# Patient Record
Sex: Female | Born: 1945 | Race: White | Hispanic: No | Marital: Married | Smoking: Former smoker
Health system: Southern US, Community
[De-identification: ages and names within clinical notes are randomized; demographics above are authoritative.]

## PROBLEM LIST (undated history)

## (undated) DIAGNOSIS — E079 Disorder of thyroid, unspecified: Secondary | ICD-10-CM

## (undated) DIAGNOSIS — J449 Chronic obstructive pulmonary disease, unspecified: Secondary | ICD-10-CM

## (undated) DIAGNOSIS — M1990 Unspecified osteoarthritis, unspecified site: Secondary | ICD-10-CM

## (undated) DIAGNOSIS — M419 Scoliosis, unspecified: Secondary | ICD-10-CM

## (undated) DIAGNOSIS — E785 Hyperlipidemia, unspecified: Secondary | ICD-10-CM

## (undated) DIAGNOSIS — I6529 Occlusion and stenosis of unspecified carotid artery: Secondary | ICD-10-CM

## (undated) DIAGNOSIS — E0500 Thyrotoxicosis with diffuse goiter without thyrotoxic crisis or storm: Secondary | ICD-10-CM

## (undated) DIAGNOSIS — E039 Hypothyroidism, unspecified: Secondary | ICD-10-CM

## (undated) DIAGNOSIS — N8110 Cystocele, unspecified: Secondary | ICD-10-CM

## (undated) DIAGNOSIS — I10 Essential (primary) hypertension: Secondary | ICD-10-CM

## (undated) DIAGNOSIS — M810 Age-related osteoporosis without current pathological fracture: Secondary | ICD-10-CM

## (undated) HISTORY — DX: Cystocele, unspecified: N81.10

## (undated) HISTORY — DX: Unspecified osteoarthritis, unspecified site: M19.90

## (undated) HISTORY — PX: TONSILLECTOMY: SUR1361

## (undated) HISTORY — DX: Occlusion and stenosis of unspecified carotid artery: I65.29

## (undated) HISTORY — DX: Hypothyroidism, unspecified: E03.9

## (undated) HISTORY — DX: Thyrotoxicosis with diffuse goiter without thyrotoxic crisis or storm: E05.00

## (undated) HISTORY — DX: Essential (primary) hypertension: I10

## (undated) HISTORY — DX: Scoliosis, unspecified: M41.9

## (undated) HISTORY — DX: Disorder of thyroid, unspecified: E07.9

## (undated) HISTORY — DX: Hyperlipidemia, unspecified: E78.5

## (undated) HISTORY — DX: Chronic obstructive pulmonary disease, unspecified: J44.9

## (undated) HISTORY — DX: Age-related osteoporosis without current pathological fracture: M81.0

---

## 2015-03-20 DIAGNOSIS — Z780 Asymptomatic menopausal state: Secondary | ICD-10-CM | POA: Diagnosis not present

## 2015-03-20 DIAGNOSIS — E785 Hyperlipidemia, unspecified: Secondary | ICD-10-CM | POA: Diagnosis not present

## 2015-03-20 DIAGNOSIS — Z79899 Other long term (current) drug therapy: Secondary | ICD-10-CM | POA: Diagnosis not present

## 2015-03-20 DIAGNOSIS — E559 Vitamin D deficiency, unspecified: Secondary | ICD-10-CM | POA: Diagnosis not present

## 2015-03-20 DIAGNOSIS — R5383 Other fatigue: Secondary | ICD-10-CM | POA: Diagnosis not present

## 2015-03-25 DIAGNOSIS — E538 Deficiency of other specified B group vitamins: Secondary | ICD-10-CM | POA: Diagnosis not present

## 2015-04-01 DIAGNOSIS — E538 Deficiency of other specified B group vitamins: Secondary | ICD-10-CM | POA: Diagnosis not present

## 2015-04-08 DIAGNOSIS — E538 Deficiency of other specified B group vitamins: Secondary | ICD-10-CM | POA: Diagnosis not present

## 2015-04-13 DIAGNOSIS — J209 Acute bronchitis, unspecified: Secondary | ICD-10-CM | POA: Diagnosis not present

## 2015-04-14 DIAGNOSIS — H3562 Retinal hemorrhage, left eye: Secondary | ICD-10-CM | POA: Diagnosis not present

## 2015-04-14 DIAGNOSIS — H5213 Myopia, bilateral: Secondary | ICD-10-CM | POA: Diagnosis not present

## 2015-04-15 DIAGNOSIS — E538 Deficiency of other specified B group vitamins: Secondary | ICD-10-CM | POA: Diagnosis not present

## 2015-05-15 DIAGNOSIS — E538 Deficiency of other specified B group vitamins: Secondary | ICD-10-CM | POA: Diagnosis not present

## 2015-05-19 DIAGNOSIS — E0590 Thyrotoxicosis, unspecified without thyrotoxic crisis or storm: Secondary | ICD-10-CM | POA: Diagnosis not present

## 2015-05-19 DIAGNOSIS — E0510 Thyrotoxicosis with toxic single thyroid nodule without thyrotoxic crisis or storm: Secondary | ICD-10-CM | POA: Diagnosis not present

## 2015-06-10 DIAGNOSIS — H3562 Retinal hemorrhage, left eye: Secondary | ICD-10-CM | POA: Diagnosis not present

## 2015-06-10 DIAGNOSIS — H2513 Age-related nuclear cataract, bilateral: Secondary | ICD-10-CM | POA: Diagnosis not present

## 2015-06-16 DIAGNOSIS — E538 Deficiency of other specified B group vitamins: Secondary | ICD-10-CM | POA: Diagnosis not present

## 2015-07-17 DIAGNOSIS — E538 Deficiency of other specified B group vitamins: Secondary | ICD-10-CM | POA: Diagnosis not present

## 2015-07-28 DIAGNOSIS — Z9181 History of falling: Secondary | ICD-10-CM | POA: Diagnosis not present

## 2015-07-28 DIAGNOSIS — Z01419 Encounter for gynecological examination (general) (routine) without abnormal findings: Secondary | ICD-10-CM | POA: Diagnosis not present

## 2015-08-01 DIAGNOSIS — Z1231 Encounter for screening mammogram for malignant neoplasm of breast: Secondary | ICD-10-CM | POA: Diagnosis not present

## 2015-08-18 DIAGNOSIS — D510 Vitamin B12 deficiency anemia due to intrinsic factor deficiency: Secondary | ICD-10-CM | POA: Diagnosis not present

## 2015-09-12 DIAGNOSIS — Z86010 Personal history of colonic polyps: Secondary | ICD-10-CM | POA: Diagnosis not present

## 2015-09-12 DIAGNOSIS — Z1211 Encounter for screening for malignant neoplasm of colon: Secondary | ICD-10-CM | POA: Diagnosis not present

## 2015-09-12 DIAGNOSIS — Z8371 Family history of colonic polyps: Secondary | ICD-10-CM | POA: Diagnosis not present

## 2015-09-18 DIAGNOSIS — D510 Vitamin B12 deficiency anemia due to intrinsic factor deficiency: Secondary | ICD-10-CM | POA: Diagnosis not present

## 2015-10-08 DIAGNOSIS — Z8371 Family history of colonic polyps: Secondary | ICD-10-CM | POA: Diagnosis not present

## 2015-10-08 DIAGNOSIS — K648 Other hemorrhoids: Secondary | ICD-10-CM | POA: Diagnosis not present

## 2015-10-08 DIAGNOSIS — D123 Benign neoplasm of transverse colon: Secondary | ICD-10-CM | POA: Diagnosis not present

## 2015-10-08 DIAGNOSIS — Z1211 Encounter for screening for malignant neoplasm of colon: Secondary | ICD-10-CM | POA: Diagnosis not present

## 2015-10-08 DIAGNOSIS — Z79899 Other long term (current) drug therapy: Secondary | ICD-10-CM | POA: Diagnosis not present

## 2015-10-08 DIAGNOSIS — K5730 Diverticulosis of large intestine without perforation or abscess without bleeding: Secondary | ICD-10-CM | POA: Diagnosis not present

## 2015-10-08 DIAGNOSIS — Z86010 Personal history of colonic polyps: Secondary | ICD-10-CM | POA: Diagnosis not present

## 2015-10-16 DIAGNOSIS — D510 Vitamin B12 deficiency anemia due to intrinsic factor deficiency: Secondary | ICD-10-CM | POA: Diagnosis not present

## 2015-11-17 DIAGNOSIS — D510 Vitamin B12 deficiency anemia due to intrinsic factor deficiency: Secondary | ICD-10-CM | POA: Diagnosis not present

## 2015-11-19 DIAGNOSIS — E0590 Thyrotoxicosis, unspecified without thyrotoxic crisis or storm: Secondary | ICD-10-CM | POA: Diagnosis not present

## 2015-11-19 DIAGNOSIS — E0510 Thyrotoxicosis with toxic single thyroid nodule without thyrotoxic crisis or storm: Secondary | ICD-10-CM | POA: Diagnosis not present

## 2015-12-16 DIAGNOSIS — D510 Vitamin B12 deficiency anemia due to intrinsic factor deficiency: Secondary | ICD-10-CM | POA: Diagnosis not present

## 2016-01-01 DIAGNOSIS — J209 Acute bronchitis, unspecified: Secondary | ICD-10-CM | POA: Diagnosis not present

## 2016-01-16 DIAGNOSIS — D510 Vitamin B12 deficiency anemia due to intrinsic factor deficiency: Secondary | ICD-10-CM | POA: Diagnosis not present

## 2016-01-20 DIAGNOSIS — J449 Chronic obstructive pulmonary disease, unspecified: Secondary | ICD-10-CM | POA: Diagnosis not present

## 2016-01-20 DIAGNOSIS — M8580 Other specified disorders of bone density and structure, unspecified site: Secondary | ICD-10-CM | POA: Diagnosis not present

## 2016-01-20 DIAGNOSIS — E785 Hyperlipidemia, unspecified: Secondary | ICD-10-CM | POA: Diagnosis not present

## 2016-01-20 DIAGNOSIS — Z0000 Encounter for general adult medical examination without abnormal findings: Secondary | ICD-10-CM | POA: Diagnosis not present

## 2016-01-20 DIAGNOSIS — Z79899 Other long term (current) drug therapy: Secondary | ICD-10-CM | POA: Diagnosis not present

## 2016-01-20 DIAGNOSIS — R5383 Other fatigue: Secondary | ICD-10-CM | POA: Diagnosis not present

## 2016-02-16 DIAGNOSIS — D510 Vitamin B12 deficiency anemia due to intrinsic factor deficiency: Secondary | ICD-10-CM | POA: Diagnosis not present

## 2016-03-15 DIAGNOSIS — D510 Vitamin B12 deficiency anemia due to intrinsic factor deficiency: Secondary | ICD-10-CM | POA: Diagnosis not present

## 2016-04-15 DIAGNOSIS — D510 Vitamin B12 deficiency anemia due to intrinsic factor deficiency: Secondary | ICD-10-CM | POA: Diagnosis not present

## 2016-05-14 DIAGNOSIS — D510 Vitamin B12 deficiency anemia due to intrinsic factor deficiency: Secondary | ICD-10-CM | POA: Diagnosis not present

## 2016-05-18 DIAGNOSIS — E0590 Thyrotoxicosis, unspecified without thyrotoxic crisis or storm: Secondary | ICD-10-CM | POA: Diagnosis not present

## 2016-05-18 DIAGNOSIS — E0510 Thyrotoxicosis with toxic single thyroid nodule without thyrotoxic crisis or storm: Secondary | ICD-10-CM | POA: Diagnosis not present

## 2016-06-15 DIAGNOSIS — D510 Vitamin B12 deficiency anemia due to intrinsic factor deficiency: Secondary | ICD-10-CM | POA: Diagnosis not present

## 2016-07-15 DIAGNOSIS — D510 Vitamin B12 deficiency anemia due to intrinsic factor deficiency: Secondary | ICD-10-CM | POA: Diagnosis not present

## 2016-07-30 DIAGNOSIS — H5213 Myopia, bilateral: Secondary | ICD-10-CM | POA: Diagnosis not present

## 2016-08-05 DIAGNOSIS — Z1231 Encounter for screening mammogram for malignant neoplasm of breast: Secondary | ICD-10-CM | POA: Diagnosis not present

## 2016-08-16 DIAGNOSIS — D510 Vitamin B12 deficiency anemia due to intrinsic factor deficiency: Secondary | ICD-10-CM | POA: Diagnosis not present

## 2016-09-14 DIAGNOSIS — D510 Vitamin B12 deficiency anemia due to intrinsic factor deficiency: Secondary | ICD-10-CM | POA: Diagnosis not present

## 2016-10-13 DIAGNOSIS — E041 Nontoxic single thyroid nodule: Secondary | ICD-10-CM | POA: Diagnosis not present

## 2016-10-13 DIAGNOSIS — E0510 Thyrotoxicosis with toxic single thyroid nodule without thyrotoxic crisis or storm: Secondary | ICD-10-CM | POA: Diagnosis not present

## 2016-10-15 DIAGNOSIS — D510 Vitamin B12 deficiency anemia due to intrinsic factor deficiency: Secondary | ICD-10-CM | POA: Diagnosis not present

## 2016-11-15 DIAGNOSIS — D510 Vitamin B12 deficiency anemia due to intrinsic factor deficiency: Secondary | ICD-10-CM | POA: Diagnosis not present

## 2016-11-18 DIAGNOSIS — E0510 Thyrotoxicosis with toxic single thyroid nodule without thyrotoxic crisis or storm: Secondary | ICD-10-CM | POA: Diagnosis not present

## 2016-11-18 DIAGNOSIS — E0590 Thyrotoxicosis, unspecified without thyrotoxic crisis or storm: Secondary | ICD-10-CM | POA: Diagnosis not present

## 2016-12-16 DIAGNOSIS — D510 Vitamin B12 deficiency anemia due to intrinsic factor deficiency: Secondary | ICD-10-CM | POA: Diagnosis not present

## 2017-01-14 DIAGNOSIS — D510 Vitamin B12 deficiency anemia due to intrinsic factor deficiency: Secondary | ICD-10-CM | POA: Diagnosis not present

## 2017-01-19 DIAGNOSIS — Z681 Body mass index (BMI) 19 or less, adult: Secondary | ICD-10-CM | POA: Diagnosis not present

## 2017-01-19 DIAGNOSIS — D510 Vitamin B12 deficiency anemia due to intrinsic factor deficiency: Secondary | ICD-10-CM | POA: Diagnosis not present

## 2017-01-19 DIAGNOSIS — Z1339 Encounter for screening examination for other mental health and behavioral disorders: Secondary | ICD-10-CM | POA: Diagnosis not present

## 2017-01-19 DIAGNOSIS — M8580 Other specified disorders of bone density and structure, unspecified site: Secondary | ICD-10-CM | POA: Diagnosis not present

## 2017-02-15 DIAGNOSIS — D510 Vitamin B12 deficiency anemia due to intrinsic factor deficiency: Secondary | ICD-10-CM | POA: Diagnosis not present

## 2017-03-15 DIAGNOSIS — D510 Vitamin B12 deficiency anemia due to intrinsic factor deficiency: Secondary | ICD-10-CM | POA: Diagnosis not present

## 2017-04-14 DIAGNOSIS — D510 Vitamin B12 deficiency anemia due to intrinsic factor deficiency: Secondary | ICD-10-CM | POA: Diagnosis not present

## 2017-04-14 DIAGNOSIS — M8580 Other specified disorders of bone density and structure, unspecified site: Secondary | ICD-10-CM | POA: Diagnosis not present

## 2017-04-26 DIAGNOSIS — J189 Pneumonia, unspecified organism: Secondary | ICD-10-CM | POA: Diagnosis not present

## 2017-04-26 DIAGNOSIS — K591 Functional diarrhea: Secondary | ICD-10-CM | POA: Diagnosis not present

## 2017-05-11 DIAGNOSIS — Z681 Body mass index (BMI) 19 or less, adult: Secondary | ICD-10-CM | POA: Diagnosis not present

## 2017-05-11 DIAGNOSIS — R140 Abdominal distension (gaseous): Secondary | ICD-10-CM | POA: Diagnosis not present

## 2017-05-11 DIAGNOSIS — R110 Nausea: Secondary | ICD-10-CM | POA: Diagnosis not present

## 2017-05-11 DIAGNOSIS — R109 Unspecified abdominal pain: Secondary | ICD-10-CM | POA: Diagnosis not present

## 2017-05-12 DIAGNOSIS — R140 Abdominal distension (gaseous): Secondary | ICD-10-CM | POA: Diagnosis not present

## 2017-05-16 DIAGNOSIS — D510 Vitamin B12 deficiency anemia due to intrinsic factor deficiency: Secondary | ICD-10-CM | POA: Diagnosis not present

## 2017-05-19 DIAGNOSIS — E0590 Thyrotoxicosis, unspecified without thyrotoxic crisis or storm: Secondary | ICD-10-CM | POA: Diagnosis not present

## 2017-05-19 DIAGNOSIS — E0510 Thyrotoxicosis with toxic single thyroid nodule without thyrotoxic crisis or storm: Secondary | ICD-10-CM | POA: Diagnosis not present

## 2017-06-14 DIAGNOSIS — D510 Vitamin B12 deficiency anemia due to intrinsic factor deficiency: Secondary | ICD-10-CM | POA: Diagnosis not present

## 2017-06-23 DIAGNOSIS — K529 Noninfective gastroenteritis and colitis, unspecified: Secondary | ICD-10-CM | POA: Diagnosis not present

## 2017-07-14 DIAGNOSIS — D510 Vitamin B12 deficiency anemia due to intrinsic factor deficiency: Secondary | ICD-10-CM | POA: Diagnosis not present

## 2017-07-29 DIAGNOSIS — Z681 Body mass index (BMI) 19 or less, adult: Secondary | ICD-10-CM | POA: Diagnosis not present

## 2017-07-29 DIAGNOSIS — M8580 Other specified disorders of bone density and structure, unspecified site: Secondary | ICD-10-CM | POA: Diagnosis not present

## 2017-07-29 DIAGNOSIS — E785 Hyperlipidemia, unspecified: Secondary | ICD-10-CM | POA: Diagnosis not present

## 2017-07-29 DIAGNOSIS — Z9181 History of falling: Secondary | ICD-10-CM | POA: Diagnosis not present

## 2017-08-11 DIAGNOSIS — Z1231 Encounter for screening mammogram for malignant neoplasm of breast: Secondary | ICD-10-CM | POA: Diagnosis not present

## 2017-08-15 DIAGNOSIS — D510 Vitamin B12 deficiency anemia due to intrinsic factor deficiency: Secondary | ICD-10-CM | POA: Diagnosis not present

## 2017-09-15 DIAGNOSIS — D510 Vitamin B12 deficiency anemia due to intrinsic factor deficiency: Secondary | ICD-10-CM | POA: Diagnosis not present

## 2017-10-14 DIAGNOSIS — D510 Vitamin B12 deficiency anemia due to intrinsic factor deficiency: Secondary | ICD-10-CM | POA: Diagnosis not present

## 2017-11-15 DIAGNOSIS — D510 Vitamin B12 deficiency anemia due to intrinsic factor deficiency: Secondary | ICD-10-CM | POA: Diagnosis not present

## 2017-11-21 DIAGNOSIS — E0510 Thyrotoxicosis with toxic single thyroid nodule without thyrotoxic crisis or storm: Secondary | ICD-10-CM | POA: Diagnosis not present

## 2017-11-21 DIAGNOSIS — E0590 Thyrotoxicosis, unspecified without thyrotoxic crisis or storm: Secondary | ICD-10-CM | POA: Diagnosis not present

## 2017-12-15 DIAGNOSIS — D510 Vitamin B12 deficiency anemia due to intrinsic factor deficiency: Secondary | ICD-10-CM | POA: Diagnosis not present

## 2018-01-16 DIAGNOSIS — D510 Vitamin B12 deficiency anemia due to intrinsic factor deficiency: Secondary | ICD-10-CM | POA: Diagnosis not present

## 2018-01-27 DIAGNOSIS — Z681 Body mass index (BMI) 19 or less, adult: Secondary | ICD-10-CM | POA: Diagnosis not present

## 2018-01-27 DIAGNOSIS — Z2821 Immunization not carried out because of patient refusal: Secondary | ICD-10-CM | POA: Diagnosis not present

## 2018-01-27 DIAGNOSIS — Z0000 Encounter for general adult medical examination without abnormal findings: Secondary | ICD-10-CM | POA: Diagnosis not present

## 2018-02-03 ENCOUNTER — Telehealth: Payer: Self-pay | Admitting: Gastroenterology

## 2018-02-03 NOTE — Telephone Encounter (Signed)
Needing to know when pt next colon is sched. Pt last 10.4.2017 but report do not show next.

## 2018-02-03 NOTE — Telephone Encounter (Signed)
Please review and advise.

## 2018-02-16 DIAGNOSIS — D510 Vitamin B12 deficiency anemia due to intrinsic factor deficiency: Secondary | ICD-10-CM | POA: Diagnosis not present

## 2018-02-16 DIAGNOSIS — Z0120 Encounter for dental examination and cleaning without abnormal findings: Secondary | ICD-10-CM | POA: Diagnosis not present

## 2018-02-20 NOTE — Telephone Encounter (Signed)
Can you get my last colon

## 2018-02-21 NOTE — Telephone Encounter (Signed)
This is on your desk for review.

## 2018-02-21 NOTE — Telephone Encounter (Signed)
Colonoscopy 10/2015 (PCF-difficult procedure, difficult to examine) polyp status post polypectomy. Bx- TA Recommended to repeat colonoscopy in 3 years So, due 10/2018 Please inform the patient.

## 2018-02-22 NOTE — Telephone Encounter (Signed)
Unable to reach patient as no phone number listed in chart;  Attempted to reach [facility] [phone] (THIS IS A FAX NUMBER)  Recall placed in Epic for this recall;

## 2018-03-16 DIAGNOSIS — D519 Vitamin B12 deficiency anemia, unspecified: Secondary | ICD-10-CM | POA: Diagnosis not present

## 2018-04-17 DIAGNOSIS — D510 Vitamin B12 deficiency anemia due to intrinsic factor deficiency: Secondary | ICD-10-CM | POA: Diagnosis not present

## 2018-05-16 DIAGNOSIS — D510 Vitamin B12 deficiency anemia due to intrinsic factor deficiency: Secondary | ICD-10-CM | POA: Diagnosis not present

## 2018-06-15 DIAGNOSIS — D519 Vitamin B12 deficiency anemia, unspecified: Secondary | ICD-10-CM | POA: Diagnosis not present

## 2018-07-11 DIAGNOSIS — E0590 Thyrotoxicosis, unspecified without thyrotoxic crisis or storm: Secondary | ICD-10-CM | POA: Diagnosis not present

## 2018-07-11 DIAGNOSIS — E0510 Thyrotoxicosis with toxic single thyroid nodule without thyrotoxic crisis or storm: Secondary | ICD-10-CM | POA: Diagnosis not present

## 2018-07-17 DIAGNOSIS — D519 Vitamin B12 deficiency anemia, unspecified: Secondary | ICD-10-CM | POA: Diagnosis not present

## 2018-07-28 DIAGNOSIS — Z1331 Encounter for screening for depression: Secondary | ICD-10-CM | POA: Diagnosis not present

## 2018-07-28 DIAGNOSIS — M8580 Other specified disorders of bone density and structure, unspecified site: Secondary | ICD-10-CM | POA: Diagnosis not present

## 2018-07-28 DIAGNOSIS — Z681 Body mass index (BMI) 19 or less, adult: Secondary | ICD-10-CM | POA: Diagnosis not present

## 2018-07-28 DIAGNOSIS — R0789 Other chest pain: Secondary | ICD-10-CM | POA: Diagnosis not present

## 2018-08-17 DIAGNOSIS — D519 Vitamin B12 deficiency anemia, unspecified: Secondary | ICD-10-CM | POA: Diagnosis not present

## 2018-08-31 DIAGNOSIS — Z0120 Encounter for dental examination and cleaning without abnormal findings: Secondary | ICD-10-CM | POA: Diagnosis not present

## 2018-09-18 DIAGNOSIS — D519 Vitamin B12 deficiency anemia, unspecified: Secondary | ICD-10-CM | POA: Diagnosis not present

## 2018-10-09 DIAGNOSIS — Z2821 Immunization not carried out because of patient refusal: Secondary | ICD-10-CM | POA: Diagnosis not present

## 2018-10-09 DIAGNOSIS — M5432 Sciatica, left side: Secondary | ICD-10-CM | POA: Diagnosis not present

## 2018-10-09 DIAGNOSIS — Z6820 Body mass index (BMI) 20.0-20.9, adult: Secondary | ICD-10-CM | POA: Diagnosis not present

## 2018-10-09 DIAGNOSIS — K921 Melena: Secondary | ICD-10-CM | POA: Diagnosis not present

## 2018-10-12 DIAGNOSIS — E041 Nontoxic single thyroid nodule: Secondary | ICD-10-CM | POA: Diagnosis not present

## 2018-10-12 DIAGNOSIS — E0510 Thyrotoxicosis with toxic single thyroid nodule without thyrotoxic crisis or storm: Secondary | ICD-10-CM | POA: Diagnosis not present

## 2018-10-19 DIAGNOSIS — Z87891 Personal history of nicotine dependence: Secondary | ICD-10-CM | POA: Diagnosis not present

## 2018-10-19 DIAGNOSIS — E049 Nontoxic goiter, unspecified: Secondary | ICD-10-CM | POA: Diagnosis not present

## 2018-10-19 DIAGNOSIS — E0590 Thyrotoxicosis, unspecified without thyrotoxic crisis or storm: Secondary | ICD-10-CM | POA: Diagnosis not present

## 2018-10-19 DIAGNOSIS — I493 Ventricular premature depolarization: Secondary | ICD-10-CM | POA: Diagnosis not present

## 2018-10-23 DIAGNOSIS — D510 Vitamin B12 deficiency anemia due to intrinsic factor deficiency: Secondary | ICD-10-CM | POA: Diagnosis not present

## 2018-10-27 DIAGNOSIS — J40 Bronchitis, not specified as acute or chronic: Secondary | ICD-10-CM | POA: Diagnosis not present

## 2018-10-27 DIAGNOSIS — J329 Chronic sinusitis, unspecified: Secondary | ICD-10-CM | POA: Diagnosis not present

## 2018-10-27 DIAGNOSIS — J449 Chronic obstructive pulmonary disease, unspecified: Secondary | ICD-10-CM | POA: Diagnosis not present

## 2018-11-02 DIAGNOSIS — J449 Chronic obstructive pulmonary disease, unspecified: Secondary | ICD-10-CM | POA: Diagnosis not present

## 2018-11-02 DIAGNOSIS — Z9181 History of falling: Secondary | ICD-10-CM | POA: Diagnosis not present

## 2018-11-02 DIAGNOSIS — Z6820 Body mass index (BMI) 20.0-20.9, adult: Secondary | ICD-10-CM | POA: Diagnosis not present

## 2018-11-02 DIAGNOSIS — Z1331 Encounter for screening for depression: Secondary | ICD-10-CM | POA: Diagnosis not present

## 2018-11-08 ENCOUNTER — Encounter: Payer: Self-pay | Admitting: Gastroenterology

## 2018-11-21 DIAGNOSIS — D519 Vitamin B12 deficiency anemia, unspecified: Secondary | ICD-10-CM | POA: Diagnosis not present

## 2018-11-28 DIAGNOSIS — Z1231 Encounter for screening mammogram for malignant neoplasm of breast: Secondary | ICD-10-CM | POA: Diagnosis not present

## 2018-12-19 DIAGNOSIS — E538 Deficiency of other specified B group vitamins: Secondary | ICD-10-CM | POA: Diagnosis not present

## 2019-01-11 DIAGNOSIS — E0590 Thyrotoxicosis, unspecified without thyrotoxic crisis or storm: Secondary | ICD-10-CM | POA: Diagnosis not present

## 2019-01-11 DIAGNOSIS — E0510 Thyrotoxicosis with toxic single thyroid nodule without thyrotoxic crisis or storm: Secondary | ICD-10-CM | POA: Diagnosis not present

## 2019-01-18 DIAGNOSIS — D519 Vitamin B12 deficiency anemia, unspecified: Secondary | ICD-10-CM | POA: Diagnosis not present

## 2019-02-01 DIAGNOSIS — T148XXA Other injury of unspecified body region, initial encounter: Secondary | ICD-10-CM | POA: Diagnosis not present

## 2019-02-01 DIAGNOSIS — Z01419 Encounter for gynecological examination (general) (routine) without abnormal findings: Secondary | ICD-10-CM | POA: Diagnosis not present

## 2019-02-01 DIAGNOSIS — Z6821 Body mass index (BMI) 21.0-21.9, adult: Secondary | ICD-10-CM | POA: Diagnosis not present

## 2019-02-01 DIAGNOSIS — Z1331 Encounter for screening for depression: Secondary | ICD-10-CM | POA: Diagnosis not present

## 2019-02-08 DIAGNOSIS — M545 Low back pain: Secondary | ICD-10-CM | POA: Diagnosis not present

## 2019-02-08 DIAGNOSIS — M4316 Spondylolisthesis, lumbar region: Secondary | ICD-10-CM | POA: Diagnosis not present

## 2019-02-08 DIAGNOSIS — M419 Scoliosis, unspecified: Secondary | ICD-10-CM | POA: Diagnosis not present

## 2019-02-08 DIAGNOSIS — M5416 Radiculopathy, lumbar region: Secondary | ICD-10-CM | POA: Diagnosis not present

## 2019-02-14 ENCOUNTER — Other Ambulatory Visit: Payer: Self-pay | Admitting: Rehabilitation

## 2019-02-14 DIAGNOSIS — M5416 Radiculopathy, lumbar region: Secondary | ICD-10-CM

## 2019-02-27 DIAGNOSIS — D519 Vitamin B12 deficiency anemia, unspecified: Secondary | ICD-10-CM | POA: Diagnosis not present

## 2019-03-07 ENCOUNTER — Ambulatory Visit
Admission: RE | Admit: 2019-03-07 | Discharge: 2019-03-07 | Disposition: A | Discharge status: Home / Self Care | Payer: Medicare Other | Source: Ambulatory Visit | Attending: Rehabilitation | Admitting: Rehabilitation

## 2019-03-07 ENCOUNTER — Other Ambulatory Visit: Payer: Self-pay

## 2019-03-07 DIAGNOSIS — M5416 Radiculopathy, lumbar region: Secondary | ICD-10-CM

## 2019-03-07 DIAGNOSIS — M5136 Other intervertebral disc degeneration, lumbar region: Secondary | ICD-10-CM | POA: Diagnosis not present

## 2019-03-15 DIAGNOSIS — M419 Scoliosis, unspecified: Secondary | ICD-10-CM | POA: Diagnosis not present

## 2019-03-15 DIAGNOSIS — M47816 Spondylosis without myelopathy or radiculopathy, lumbar region: Secondary | ICD-10-CM | POA: Diagnosis not present

## 2019-03-15 DIAGNOSIS — M4316 Spondylolisthesis, lumbar region: Secondary | ICD-10-CM | POA: Diagnosis not present

## 2019-03-15 DIAGNOSIS — M5416 Radiculopathy, lumbar region: Secondary | ICD-10-CM | POA: Diagnosis not present

## 2019-03-21 DIAGNOSIS — Z0120 Encounter for dental examination and cleaning without abnormal findings: Secondary | ICD-10-CM | POA: Diagnosis not present

## 2019-03-26 DIAGNOSIS — M47816 Spondylosis without myelopathy or radiculopathy, lumbar region: Secondary | ICD-10-CM | POA: Diagnosis not present

## 2019-04-03 DIAGNOSIS — M47816 Spondylosis without myelopathy or radiculopathy, lumbar region: Secondary | ICD-10-CM | POA: Diagnosis not present

## 2019-04-24 DIAGNOSIS — D510 Vitamin B12 deficiency anemia due to intrinsic factor deficiency: Secondary | ICD-10-CM | POA: Diagnosis not present

## 2019-05-02 DIAGNOSIS — M5136 Other intervertebral disc degeneration, lumbar region: Secondary | ICD-10-CM | POA: Diagnosis not present

## 2019-05-10 DIAGNOSIS — H5211 Myopia, right eye: Secondary | ICD-10-CM | POA: Diagnosis not present

## 2019-05-17 DIAGNOSIS — M5136 Other intervertebral disc degeneration, lumbar region: Secondary | ICD-10-CM | POA: Diagnosis not present

## 2019-05-17 DIAGNOSIS — M419 Scoliosis, unspecified: Secondary | ICD-10-CM | POA: Diagnosis not present

## 2019-05-17 DIAGNOSIS — M4316 Spondylolisthesis, lumbar region: Secondary | ICD-10-CM | POA: Diagnosis not present

## 2019-05-17 DIAGNOSIS — M47816 Spondylosis without myelopathy or radiculopathy, lumbar region: Secondary | ICD-10-CM | POA: Diagnosis not present

## 2019-05-29 DIAGNOSIS — M5136 Other intervertebral disc degeneration, lumbar region: Secondary | ICD-10-CM | POA: Diagnosis not present

## 2019-06-12 DIAGNOSIS — E538 Deficiency of other specified B group vitamins: Secondary | ICD-10-CM | POA: Diagnosis not present

## 2019-06-26 DIAGNOSIS — M5136 Other intervertebral disc degeneration, lumbar region: Secondary | ICD-10-CM | POA: Diagnosis not present

## 2019-06-26 DIAGNOSIS — M4316 Spondylolisthesis, lumbar region: Secondary | ICD-10-CM | POA: Diagnosis not present

## 2019-06-26 DIAGNOSIS — M419 Scoliosis, unspecified: Secondary | ICD-10-CM | POA: Diagnosis not present

## 2019-06-26 DIAGNOSIS — M47816 Spondylosis without myelopathy or radiculopathy, lumbar region: Secondary | ICD-10-CM | POA: Diagnosis not present

## 2019-07-11 DIAGNOSIS — E0510 Thyrotoxicosis with toxic single thyroid nodule without thyrotoxic crisis or storm: Secondary | ICD-10-CM | POA: Diagnosis not present

## 2019-07-11 DIAGNOSIS — E0590 Thyrotoxicosis, unspecified without thyrotoxic crisis or storm: Secondary | ICD-10-CM | POA: Diagnosis not present

## 2019-07-12 DIAGNOSIS — D510 Vitamin B12 deficiency anemia due to intrinsic factor deficiency: Secondary | ICD-10-CM | POA: Diagnosis not present

## 2019-07-27 DIAGNOSIS — M47816 Spondylosis without myelopathy or radiculopathy, lumbar region: Secondary | ICD-10-CM | POA: Diagnosis not present

## 2019-07-27 DIAGNOSIS — M4316 Spondylolisthesis, lumbar region: Secondary | ICD-10-CM | POA: Diagnosis not present

## 2019-07-27 DIAGNOSIS — M5416 Radiculopathy, lumbar region: Secondary | ICD-10-CM | POA: Diagnosis not present

## 2019-07-27 DIAGNOSIS — M419 Scoliosis, unspecified: Secondary | ICD-10-CM | POA: Diagnosis not present

## 2019-07-30 DIAGNOSIS — Z1331 Encounter for screening for depression: Secondary | ICD-10-CM | POA: Diagnosis not present

## 2019-07-30 DIAGNOSIS — K219 Gastro-esophageal reflux disease without esophagitis: Secondary | ICD-10-CM | POA: Diagnosis not present

## 2019-07-30 DIAGNOSIS — Z6821 Body mass index (BMI) 21.0-21.9, adult: Secondary | ICD-10-CM | POA: Diagnosis not present

## 2019-07-30 DIAGNOSIS — E7800 Pure hypercholesterolemia, unspecified: Secondary | ICD-10-CM | POA: Diagnosis not present

## 2019-08-01 DIAGNOSIS — M419 Scoliosis, unspecified: Secondary | ICD-10-CM | POA: Diagnosis not present

## 2019-08-01 DIAGNOSIS — M47816 Spondylosis without myelopathy or radiculopathy, lumbar region: Secondary | ICD-10-CM | POA: Diagnosis not present

## 2019-08-01 DIAGNOSIS — M4316 Spondylolisthesis, lumbar region: Secondary | ICD-10-CM | POA: Diagnosis not present

## 2019-08-01 DIAGNOSIS — M545 Low back pain: Secondary | ICD-10-CM | POA: Diagnosis not present

## 2019-08-03 DIAGNOSIS — M419 Scoliosis, unspecified: Secondary | ICD-10-CM | POA: Diagnosis not present

## 2019-08-03 DIAGNOSIS — M545 Low back pain: Secondary | ICD-10-CM | POA: Diagnosis not present

## 2019-08-03 DIAGNOSIS — M47816 Spondylosis without myelopathy or radiculopathy, lumbar region: Secondary | ICD-10-CM | POA: Diagnosis not present

## 2019-08-03 DIAGNOSIS — M4316 Spondylolisthesis, lumbar region: Secondary | ICD-10-CM | POA: Diagnosis not present

## 2019-08-08 DIAGNOSIS — M419 Scoliosis, unspecified: Secondary | ICD-10-CM | POA: Diagnosis not present

## 2019-08-08 DIAGNOSIS — M4316 Spondylolisthesis, lumbar region: Secondary | ICD-10-CM | POA: Diagnosis not present

## 2019-08-08 DIAGNOSIS — M47816 Spondylosis without myelopathy or radiculopathy, lumbar region: Secondary | ICD-10-CM | POA: Diagnosis not present

## 2019-08-08 DIAGNOSIS — M545 Low back pain: Secondary | ICD-10-CM | POA: Diagnosis not present

## 2019-08-10 DIAGNOSIS — M47816 Spondylosis without myelopathy or radiculopathy, lumbar region: Secondary | ICD-10-CM | POA: Diagnosis not present

## 2019-08-10 DIAGNOSIS — M4316 Spondylolisthesis, lumbar region: Secondary | ICD-10-CM | POA: Diagnosis not present

## 2019-08-10 DIAGNOSIS — M545 Low back pain: Secondary | ICD-10-CM | POA: Diagnosis not present

## 2019-08-10 DIAGNOSIS — M419 Scoliosis, unspecified: Secondary | ICD-10-CM | POA: Diagnosis not present

## 2019-08-15 DIAGNOSIS — M545 Low back pain: Secondary | ICD-10-CM | POA: Diagnosis not present

## 2019-08-15 DIAGNOSIS — M419 Scoliosis, unspecified: Secondary | ICD-10-CM | POA: Diagnosis not present

## 2019-08-15 DIAGNOSIS — M4316 Spondylolisthesis, lumbar region: Secondary | ICD-10-CM | POA: Diagnosis not present

## 2019-08-15 DIAGNOSIS — M47816 Spondylosis without myelopathy or radiculopathy, lumbar region: Secondary | ICD-10-CM | POA: Diagnosis not present

## 2019-08-15 DIAGNOSIS — D510 Vitamin B12 deficiency anemia due to intrinsic factor deficiency: Secondary | ICD-10-CM | POA: Diagnosis not present

## 2019-08-17 DIAGNOSIS — M4316 Spondylolisthesis, lumbar region: Secondary | ICD-10-CM | POA: Diagnosis not present

## 2019-08-17 DIAGNOSIS — M419 Scoliosis, unspecified: Secondary | ICD-10-CM | POA: Diagnosis not present

## 2019-08-17 DIAGNOSIS — M545 Low back pain: Secondary | ICD-10-CM | POA: Diagnosis not present

## 2019-08-17 DIAGNOSIS — M47816 Spondylosis without myelopathy or radiculopathy, lumbar region: Secondary | ICD-10-CM | POA: Diagnosis not present

## 2019-08-20 DIAGNOSIS — M47816 Spondylosis without myelopathy or radiculopathy, lumbar region: Secondary | ICD-10-CM | POA: Diagnosis not present

## 2019-08-20 DIAGNOSIS — M545 Low back pain: Secondary | ICD-10-CM | POA: Diagnosis not present

## 2019-08-20 DIAGNOSIS — M4316 Spondylolisthesis, lumbar region: Secondary | ICD-10-CM | POA: Diagnosis not present

## 2019-08-20 DIAGNOSIS — M419 Scoliosis, unspecified: Secondary | ICD-10-CM | POA: Diagnosis not present

## 2019-08-22 DIAGNOSIS — M4316 Spondylolisthesis, lumbar region: Secondary | ICD-10-CM | POA: Diagnosis not present

## 2019-08-22 DIAGNOSIS — M545 Low back pain: Secondary | ICD-10-CM | POA: Diagnosis not present

## 2019-08-22 DIAGNOSIS — M47816 Spondylosis without myelopathy or radiculopathy, lumbar region: Secondary | ICD-10-CM | POA: Diagnosis not present

## 2019-08-22 DIAGNOSIS — M419 Scoliosis, unspecified: Secondary | ICD-10-CM | POA: Diagnosis not present

## 2019-08-27 DIAGNOSIS — M545 Low back pain: Secondary | ICD-10-CM | POA: Diagnosis not present

## 2019-08-27 DIAGNOSIS — M4316 Spondylolisthesis, lumbar region: Secondary | ICD-10-CM | POA: Diagnosis not present

## 2019-08-27 DIAGNOSIS — M419 Scoliosis, unspecified: Secondary | ICD-10-CM | POA: Diagnosis not present

## 2019-08-27 DIAGNOSIS — M47816 Spondylosis without myelopathy or radiculopathy, lumbar region: Secondary | ICD-10-CM | POA: Diagnosis not present

## 2019-08-29 DIAGNOSIS — M419 Scoliosis, unspecified: Secondary | ICD-10-CM | POA: Diagnosis not present

## 2019-08-29 DIAGNOSIS — M545 Low back pain: Secondary | ICD-10-CM | POA: Diagnosis not present

## 2019-08-29 DIAGNOSIS — M4316 Spondylolisthesis, lumbar region: Secondary | ICD-10-CM | POA: Diagnosis not present

## 2019-08-29 DIAGNOSIS — M47816 Spondylosis without myelopathy or radiculopathy, lumbar region: Secondary | ICD-10-CM | POA: Diagnosis not present

## 2019-09-03 DIAGNOSIS — M545 Low back pain: Secondary | ICD-10-CM | POA: Diagnosis not present

## 2019-09-03 DIAGNOSIS — M4316 Spondylolisthesis, lumbar region: Secondary | ICD-10-CM | POA: Diagnosis not present

## 2019-09-03 DIAGNOSIS — M419 Scoliosis, unspecified: Secondary | ICD-10-CM | POA: Diagnosis not present

## 2019-09-03 DIAGNOSIS — M47816 Spondylosis without myelopathy or radiculopathy, lumbar region: Secondary | ICD-10-CM | POA: Diagnosis not present

## 2019-09-05 DIAGNOSIS — M4316 Spondylolisthesis, lumbar region: Secondary | ICD-10-CM | POA: Diagnosis not present

## 2019-09-05 DIAGNOSIS — M545 Low back pain: Secondary | ICD-10-CM | POA: Diagnosis not present

## 2019-09-05 DIAGNOSIS — M419 Scoliosis, unspecified: Secondary | ICD-10-CM | POA: Diagnosis not present

## 2019-09-05 DIAGNOSIS — M47816 Spondylosis without myelopathy or radiculopathy, lumbar region: Secondary | ICD-10-CM | POA: Diagnosis not present

## 2019-09-19 DIAGNOSIS — D510 Vitamin B12 deficiency anemia due to intrinsic factor deficiency: Secondary | ICD-10-CM | POA: Diagnosis not present

## 2019-10-03 DIAGNOSIS — J449 Chronic obstructive pulmonary disease, unspecified: Secondary | ICD-10-CM | POA: Diagnosis not present

## 2019-10-03 DIAGNOSIS — E039 Hypothyroidism, unspecified: Secondary | ICD-10-CM | POA: Diagnosis not present

## 2019-10-03 DIAGNOSIS — G459 Transient cerebral ischemic attack, unspecified: Secondary | ICD-10-CM | POA: Diagnosis not present

## 2019-10-03 DIAGNOSIS — R0602 Shortness of breath: Secondary | ICD-10-CM | POA: Diagnosis not present

## 2019-10-03 DIAGNOSIS — I340 Nonrheumatic mitral (valve) insufficiency: Secondary | ICD-10-CM | POA: Diagnosis not present

## 2019-10-03 DIAGNOSIS — Z87891 Personal history of nicotine dependence: Secondary | ICD-10-CM | POA: Diagnosis not present

## 2019-10-03 DIAGNOSIS — Z79899 Other long term (current) drug therapy: Secondary | ICD-10-CM | POA: Diagnosis not present

## 2019-10-03 DIAGNOSIS — Z880 Allergy status to penicillin: Secondary | ICD-10-CM | POA: Diagnosis not present

## 2019-10-03 DIAGNOSIS — I10 Essential (primary) hypertension: Secondary | ICD-10-CM | POA: Diagnosis not present

## 2019-10-04 DIAGNOSIS — G459 Transient cerebral ischemic attack, unspecified: Secondary | ICD-10-CM | POA: Diagnosis not present

## 2019-10-04 DIAGNOSIS — M810 Age-related osteoporosis without current pathological fracture: Secondary | ICD-10-CM | POA: Diagnosis not present

## 2019-10-04 DIAGNOSIS — E7849 Other hyperlipidemia: Secondary | ICD-10-CM | POA: Diagnosis not present

## 2019-10-04 DIAGNOSIS — J449 Chronic obstructive pulmonary disease, unspecified: Secondary | ICD-10-CM | POA: Diagnosis not present

## 2019-10-04 DIAGNOSIS — E559 Vitamin D deficiency, unspecified: Secondary | ICD-10-CM | POA: Diagnosis not present

## 2019-10-04 DIAGNOSIS — I10 Essential (primary) hypertension: Secondary | ICD-10-CM | POA: Diagnosis not present

## 2019-10-08 DIAGNOSIS — Z6820 Body mass index (BMI) 20.0-20.9, adult: Secondary | ICD-10-CM | POA: Diagnosis not present

## 2019-10-08 DIAGNOSIS — E7800 Pure hypercholesterolemia, unspecified: Secondary | ICD-10-CM | POA: Diagnosis not present

## 2019-10-08 DIAGNOSIS — I6529 Occlusion and stenosis of unspecified carotid artery: Secondary | ICD-10-CM | POA: Diagnosis not present

## 2019-10-08 DIAGNOSIS — Z8673 Personal history of transient ischemic attack (TIA), and cerebral infarction without residual deficits: Secondary | ICD-10-CM | POA: Diagnosis not present

## 2019-10-10 DIAGNOSIS — Z0120 Encounter for dental examination and cleaning without abnormal findings: Secondary | ICD-10-CM | POA: Diagnosis not present

## 2019-10-24 DIAGNOSIS — I6529 Occlusion and stenosis of unspecified carotid artery: Secondary | ICD-10-CM | POA: Diagnosis not present

## 2019-11-03 DIAGNOSIS — E559 Vitamin D deficiency, unspecified: Secondary | ICD-10-CM | POA: Diagnosis not present

## 2019-11-03 DIAGNOSIS — M810 Age-related osteoporosis without current pathological fracture: Secondary | ICD-10-CM | POA: Diagnosis not present

## 2019-11-03 DIAGNOSIS — E7849 Other hyperlipidemia: Secondary | ICD-10-CM | POA: Diagnosis not present

## 2019-11-05 DIAGNOSIS — D510 Vitamin B12 deficiency anemia due to intrinsic factor deficiency: Secondary | ICD-10-CM | POA: Diagnosis not present

## 2019-11-07 ENCOUNTER — Encounter: Payer: Self-pay | Admitting: *Deleted

## 2019-11-13 ENCOUNTER — Other Ambulatory Visit: Payer: Self-pay

## 2019-11-13 ENCOUNTER — Ambulatory Visit: Payer: Medicare Other | Admitting: Vascular Surgery

## 2019-11-13 ENCOUNTER — Encounter: Payer: Self-pay | Admitting: Vascular Surgery

## 2019-11-13 VITALS — BP 146/81 | HR 85 | Temp 97.3°F | Resp 20 | Ht 62.5 in | Wt 121.0 lb

## 2019-11-13 DIAGNOSIS — I6523 Occlusion and stenosis of bilateral carotid arteries: Secondary | ICD-10-CM

## 2019-11-13 NOTE — Progress Notes (Signed)
ASSESSMENT & PLAN:  74 y.o. female with Joanne Richards is a 74 y.o. female with TIA in September 2021 with left hemispheric symptoms (expressive aphasia +/- blurred vision), <50% left sided carotid artery stenosis by outside duplex report.  Because of the very mild (<50%) nature of her carotid stenosis I do not think she would benefit from endarterectomy at this point.  I have ordered a CTA head and neck to confirm the duplex images.  She is scheduled to see neurology 12/13/2019.  I will see her back after both of these have been completed to discuss next steps.  The patient should continue best medical therapy for carotid artery stenosis including: Complete cessation from all tobacco products. Blood glucose control with goal A1c < 7%. Blood pressure control with goal blood pressure < 140/90 mmHg. Lipid reduction therapy with goal LDL-C <100 mg/dL (<27 if symptomatic from carotid artery stenosis).  Aspirin 81mg  PO QD.  Atorvastatin 40-80mg  PO QD (or other "high intensity" statin therapy).  CHIEF COMPLAINT:   Carotid stenosis  HISTORY:  HISTORY OF PRESENT ILLNESS: Joanne Richards is a 74 y.o. female recently admitted at [facility] for temporary ischemic attack in early September.  Patient reports she was at home talking with her husband when she had a sudden inability to express herself.  This lasted for 5 minutes.  She went to get herself cleaned up to go to the hospital when it spontaneously resolved.  The next morning she called her primary care physician office to schedule an appointment and they recommended she go to the ER.  Patient was admitted and stroke work-up performed.  This included a CT of the head and echocardiogram and an MRI of the brain.  The MRI showed no evidence of stroke.  A duplex ultrasound of the carotid arteries was ultimately performed in late September.  This demonstrated mild left carotid artery stenosis (greatest peak systolic velocity 108 cm/s); and moderate right  carotid artery stenosis (greatest peak systolic velocity 167 cm/s).  On my evaluation, the patient is asymptomatic.  She reports some blurring in her eyes which has been a chronic problem since she was been diagnosed with cataracts.  She reports no more episodes of expressive aphasia.  She specifically denies weakness, numbness, difficulty swallowing, facial droop.  She is right-handed.  Past Medical History:  Diagnosis Date  . Carotid artery occlusion   . COPD (chronic obstructive pulmonary disease) (HCC)   . Hyperlipidemia   . Hypertension   . Osteoporosis   . Thyroid disease     Past Surgical History:  Procedure Laterality Date  . TONSILLECTOMY      History reviewed. No pertinent family history.  Social History   Socioeconomic History  . Marital status: Married    Spouse name: Not on file  . Number of children: Not on file  . Years of education: Not on file  . Highest education level: Not on file  Occupational History  . Not on file  Tobacco Use  . Smoking status: Former Smoker    Quit date: 08/15/2018    Years since quitting: 1.2  . Smokeless tobacco: Never Used  Vaping Use  . Vaping Use: Never used  Substance and Sexual Activity  . Alcohol use: Not Currently  . Drug use: Not on file  . Sexual activity: Not on file  Other Topics Concern  . Not on file  Social History Narrative  . Not on file   Social Determinants of Health   Financial  Resource Strain:   . Difficulty of Paying Living Expenses: Not on file  Food Insecurity:   . Worried About Programme researcher, broadcasting/film/video in the Last Year: Not on file  . Ran Out of Food in the Last Year: Not on file  Transportation Needs:   . Lack of Transportation (Medical): Not on file  . Lack of Transportation (Non-Medical): Not on file  Physical Activity:   . Days of Exercise per Week: Not on file  . Minutes of Exercise per Session: Not on file  Stress:   . Feeling of Stress : Not on file  Social Connections:   . Frequency of  Communication with Friends and Family: Not on file  . Frequency of Social Gatherings with Friends and Family: Not on file  . Attends Religious Services: Not on file  . Active Member of Clubs or Organizations: Not on file  . Attends Banker Meetings: Not on file  . Marital Status: Not on file  Intimate Partner Violence:   . Fear of Current or Ex-Partner: Not on file  . Emotionally Abused: Not on file  . Physically Abused: Not on file  . Sexually Abused: Not on file    Allergies  Allergen Reactions  . Amoxicillin   . Clindamycin Other (See Comments)    Joint pain  . Hydrochlorothiazide W-Triamterene Other (See Comments)    syncope  . Hydromorphone Itching  . Prednisone Itching  . Etodolac Rash  . Fentanyl Itching and Rash  . Minocycline Rash  . Morphine Itching and Rash  . Oxycodone-Acetaminophen Rash  . Phenobarbital Rash  . Sulfamethoxazole Rash    Current Outpatient Medications  Medication Sig Dispense Refill  . aspirin 325 MG EC tablet Take 325 mg by mouth daily.    Marland Kitchen atorvastatin (LIPITOR) 20 MG tablet Take 20 mg by mouth daily.    Marland Kitchen denosumab (PROLIA) 60 MG/ML SOSY injection Inject 60 mg into the skin every 6 (six) months.    . ergocalciferol (VITAMIN D2) 1.25 MG (50000 UT) capsule Take 1 capsule every 2 weeks    . famotidine (PEPCID) 40 MG tablet Take 40 mg by mouth daily.    Marland Kitchen gabapentin (NEURONTIN) 100 MG capsule Take 100 mg by mouth 3 (three) times daily.    . methimazole (TAPAZOLE) 5 MG tablet Take 1/2 (one-half) tablet by mouth once daily    . metoprolol succinate (TOPROL-XL) 25 MG 24 hr tablet Take 12.5 mg by mouth daily.    . polyethylene glycol (MIRALAX / GLYCOLAX) 17 g packet Take 17 g by mouth daily.     No current facility-administered medications for this visit.    REVIEW OF SYSTEMS:  [X]  denotes positive finding, [ ]  denotes negative finding Cardiac  Comments:  Chest pain or chest pressure:    Shortness of breath upon exertion: x     Short of breath when lying flat:    Irregular heart rhythm:        Vascular    Pain in calf, thigh, or hip brought on by ambulation:    Pain in feet at night that wakes you up from your sleep:     Blood clot in your veins:    Leg swelling:         Pulmonary    Oxygen at home:    Productive cough:  x   Wheezing:         Neurologic    Sudden weakness in arms or legs:     Sudden  numbness in arms or legs:     Sudden onset of difficulty speaking or slurred speech:    Temporary loss of vision in one eye:     Problems with dizziness:         Gastrointestinal    Blood in stool:     Vomited blood:         Genitourinary    Burning when urinating:     Blood in urine:        Psychiatric    Major depression:         Hematologic    Bleeding problems:    Problems with blood clotting too easily:        Skin    Rashes or ulcers:        Constitutional    Fever or chills:     PHYSICAL EXAM:   Vitals:   11/13/19 1254 11/13/19 1257  BP: (!) 142/78 (left) (!) 146/81 (right)  Pulse: 85   Resp: 20   Temp: (!) 97.3 F (36.3 C)   SpO2: 96%   Weight: 121 lb (54.9 kg)   Height: 5' 2.5" (1.588 m)     Constitutional: well appearing in no distress. Appears well nourished.  Neurologic: Normal gait and station. CN intact. No weakness. No sensory loss. Psychiatric: Mood and affect symmetric and appropriate. Eyes: No icterus. No conjunctival pallor. Ears, nose, throat: mucous membranes moist. midline trachea.  Cardiac: regular rate and rhythm.  Respiratory: unlabored Abdominal: soft, non-tender, non-distended.  Peripheral vascular:  Radial pulse: L 2+ / R 2+  Dorsalis pedis pulse: L 2+ / R 2+ Extremity: No edema. No cyanosis. No pallor.  Skin: No gangrene. No ulceration.  Lymphatic: No Stemmer's sign. No palpable lymphadenopathy.  DATA REVIEW:   Outside duplex of bilateral carotid artery 10/04/2019: Right internal carotid artery peak systolic velocity 167 cm second Left  internal carotid artery peak systolic velocity 108 cm second Antegrade flow in bilateral vertebral arteries 50 to 69% right internal carotid artery stenosis Less than 50% left carotid artery stenosis   Rande Brunt. Lenell Antu, MD [facility] Phone Number: [phone] 11/13/2019 1:32 PM

## 2019-11-21 ENCOUNTER — Other Ambulatory Visit: Payer: Self-pay | Admitting: *Deleted

## 2019-11-21 DIAGNOSIS — I6523 Occlusion and stenosis of bilateral carotid arteries: Secondary | ICD-10-CM

## 2019-11-22 ENCOUNTER — Other Ambulatory Visit: Payer: Self-pay | Admitting: *Deleted

## 2019-11-22 DIAGNOSIS — I6523 Occlusion and stenosis of bilateral carotid arteries: Secondary | ICD-10-CM

## 2019-12-03 DIAGNOSIS — Z23 Encounter for immunization: Secondary | ICD-10-CM | POA: Diagnosis not present

## 2019-12-03 DIAGNOSIS — N814 Uterovaginal prolapse, unspecified: Secondary | ICD-10-CM | POA: Diagnosis not present

## 2019-12-03 DIAGNOSIS — Z6821 Body mass index (BMI) 21.0-21.9, adult: Secondary | ICD-10-CM | POA: Diagnosis not present

## 2019-12-03 DIAGNOSIS — Z2821 Immunization not carried out because of patient refusal: Secondary | ICD-10-CM | POA: Diagnosis not present

## 2019-12-07 ENCOUNTER — Ambulatory Visit (HOSPITAL_COMMUNITY)
Admission: RE | Admit: 2019-12-07 | Discharge: 2019-12-07 | Disposition: A | Discharge status: Home / Self Care | Payer: Medicare Other | Source: Ambulatory Visit | Attending: Vascular Surgery | Admitting: Vascular Surgery

## 2019-12-07 ENCOUNTER — Other Ambulatory Visit: Payer: Self-pay

## 2019-12-07 ENCOUNTER — Ambulatory Visit (HOSPITAL_COMMUNITY): Admission: RE | Admit: 2019-12-07 | Payer: Medicare Other | Source: Ambulatory Visit

## 2019-12-07 DIAGNOSIS — E041 Nontoxic single thyroid nodule: Secondary | ICD-10-CM | POA: Diagnosis not present

## 2019-12-07 DIAGNOSIS — Q283 Other malformations of cerebral vessels: Secondary | ICD-10-CM | POA: Diagnosis not present

## 2019-12-07 DIAGNOSIS — I6523 Occlusion and stenosis of bilateral carotid arteries: Secondary | ICD-10-CM | POA: Diagnosis not present

## 2019-12-07 LAB — POCT I-STAT CREATININE: Creatinine, Ser: 1 mg/dL (ref 0.44–1.00)

## 2019-12-07 MED ORDER — IOHEXOL 350 MG/ML SOLN
50.0000 mL | Freq: Once | INTRAVENOUS | Status: AC | PRN
  Administered 2019-12-07: 50 mL via INTRAVENOUS

## 2019-12-11 ENCOUNTER — Encounter: Payer: Self-pay | Admitting: Vascular Surgery

## 2019-12-11 ENCOUNTER — Ambulatory Visit (INDEPENDENT_AMBULATORY_CARE_PROVIDER_SITE_OTHER): Payer: Medicare Other | Admitting: Vascular Surgery

## 2019-12-11 ENCOUNTER — Other Ambulatory Visit: Payer: Self-pay

## 2019-12-11 VITALS — BP 158/76 | HR 68 | Temp 97.3°F | Resp 20 | Ht 62.5 in | Wt 121.0 lb

## 2019-12-11 DIAGNOSIS — Z8673 Personal history of transient ischemic attack (TIA), and cerebral infarction without residual deficits: Secondary | ICD-10-CM | POA: Diagnosis not present

## 2019-12-11 NOTE — Progress Notes (Signed)
ASSESSMENT & PLAN:  Joanne Richards is a 74 y.o. female with TIA in September 2021 with left hemispheric symptoms (expressive aphasia +/- blurred vision), <50% left sided carotid artery stenosis by outside duplex report. CTA performed 12/07/19 reveals no significant cervical carotid artery stenosis. She does have some intracranial carotid stenosis.   The patient should continue best medical therapy for intracranial carotid artery stenosis including: Complete cessation from all tobacco products. Blood glucose control with goal A1c < 7%. Blood pressure control with goal blood pressure < 140/90 mmHg. Lipid reduction therapy with goal LDL-C <100 mg/dL. Aspirin 81mg  PO QD.  Atorvastatin 40-80mg  PO QD (or other "high intensity" statin therapy).  She should keep her appointment with neurology. Follow up with me as needed. We will refer her to general surgery for thyroid nodule evaluation.  CHIEF COMPLAINT:   Carotid stenosis  HISTORY:  HISTORY OF PRESENT ILLNESS: Joanne Richards is a 74 y.o. female recently admitted at [facility] for temporary ischemic attack in early September.  Patient reports she was at home talking with her husband when she had a sudden inability to express herself.  This lasted for 5 minutes.  She went to get herself cleaned up to go to the hospital when it spontaneously resolved.  The next morning she called her primary care physician office to schedule an appointment and they recommended she go to the ER.  Patient was admitted and stroke work-up performed.  This included a CT of the head and echocardiogram and an MRI of the brain.  The MRI showed no evidence of stroke.  A duplex ultrasound of the carotid arteries was ultimately performed in late September.  This demonstrated mild left carotid artery stenosis (greatest peak systolic velocity 108 cm/s); and moderate right carotid artery stenosis (greatest peak systolic velocity 167 cm/s).  On my evaluation, the patient is  asymptomatic.  She reports some blurring in her eyes which has been a chronic problem since she was been diagnosed with cataracts.  She reports no more episodes of expressive aphasia.  She specifically denies weakness, numbness, difficulty swallowing, facial droop.  She is right-handed.  12/11/19: She returns to clinic to discuss CTA results. She continues to have no further episodes of TIA/CVA.   Past Medical History:  Diagnosis Date  . Carotid artery occlusion   . COPD (chronic obstructive pulmonary disease) (HCC)   . Hyperlipidemia   . Hypertension   . Osteoporosis   . Thyroid disease     Past Surgical History:  Procedure Laterality Date  . TONSILLECTOMY      No family history on file.  Social History   Socioeconomic History  . Marital status: Married    Spouse name: Not on file  . Number of children: Not on file  . Years of education: Not on file  . Highest education level: Not on file  Occupational History  . Not on file  Tobacco Use  . Smoking status: Former Smoker    Quit date: 08/15/2018    Years since quitting: 1.3  . Smokeless tobacco: Never Used  Vaping Use  . Vaping Use: Never used  Substance and Sexual Activity  . Alcohol use: Not Currently  . Drug use: Not on file  . Sexual activity: Not on file  Other Topics Concern  . Not on file  Social History Narrative  . Not on file   Social Determinants of Health   Financial Resource Strain:   . Difficulty of Paying Living Expenses: Not on  file  Food Insecurity:   . Worried About Programme researcher, broadcasting/film/video in the Last Year: Not on file  . Ran Out of Food in the Last Year: Not on file  Transportation Needs:   . Lack of Transportation (Medical): Not on file  . Lack of Transportation (Non-Medical): Not on file  Physical Activity:   . Days of Exercise per Week: Not on file  . Minutes of Exercise per Session: Not on file  Stress:   . Feeling of Stress : Not on file  Social Connections:   . Frequency of  Communication with Friends and Family: Not on file  . Frequency of Social Gatherings with Friends and Family: Not on file  . Attends Religious Services: Not on file  . Active Member of Clubs or Organizations: Not on file  . Attends Banker Meetings: Not on file  . Marital Status: Not on file  Intimate Partner Violence:   . Fear of Current or Ex-Partner: Not on file  . Emotionally Abused: Not on file  . Physically Abused: Not on file  . Sexually Abused: Not on file    Allergies  Allergen Reactions  . Amoxicillin   . Clindamycin Other (See Comments)    Joint pain  . Hydrochlorothiazide W-Triamterene Other (See Comments)    syncope  . Hydromorphone Itching  . Prednisone Itching  . Etodolac Rash  . Fentanyl Itching and Rash  . Minocycline Rash  . Morphine Itching and Rash  . Oxycodone-Acetaminophen Rash  . Phenobarbital Rash  . Sulfamethoxazole Rash    Current Outpatient Medications  Medication Sig Dispense Refill  . aspirin 325 MG EC tablet Take 325 mg by mouth daily.    Marland Kitchen atorvastatin (LIPITOR) 20 MG tablet Take 20 mg by mouth daily.    Marland Kitchen denosumab (PROLIA) 60 MG/ML SOSY injection Inject 60 mg into the skin every 6 (six) months.    . ergocalciferol (VITAMIN D2) 1.25 MG (50000 UT) capsule Take 1 capsule every 2 weeks    . famotidine (PEPCID) 40 MG tablet Take 40 mg by mouth daily.    Marland Kitchen gabapentin (NEURONTIN) 100 MG capsule Take 100 mg by mouth 3 (three) times daily.    . methimazole (TAPAZOLE) 5 MG tablet Take 1/2 (one-half) tablet by mouth once daily    . metoprolol succinate (TOPROL-XL) 25 MG 24 hr tablet Take 12.5 mg by mouth daily.    . polyethylene glycol (MIRALAX / GLYCOLAX) 17 g packet Take 17 g by mouth daily.     No current facility-administered medications for this visit.    REVIEW OF SYSTEMS:  [X]  denotes positive finding, [ ]  denotes negative finding Cardiac  Comments:  Chest pain or chest pressure:    Shortness of breath upon exertion: x     Short of breath when lying flat:    Irregular heart rhythm:        Vascular    Pain in calf, thigh, or hip brought on by ambulation:    Pain in feet at night that wakes you up from your sleep:     Blood clot in your veins:    Leg swelling:         Pulmonary    Oxygen at home:    Productive cough:  x   Wheezing:         Neurologic    Sudden weakness in arms or legs:     Sudden numbness in arms or legs:     Sudden onset of  difficulty speaking or slurred speech:    Temporary loss of vision in one eye:     Problems with dizziness:         Gastrointestinal    Blood in stool:     Vomited blood:         Genitourinary    Burning when urinating:     Blood in urine:        Psychiatric    Major depression:         Hematologic    Bleeding problems:    Problems with blood clotting too easily:        Skin    Rashes or ulcers:        Constitutional    Fever or chills:     PHYSICAL EXAM:   Vitals:   11/13/19 1254 11/13/19 1257  BP: (!) 142/78 (left) (!) 146/81 (right)  Pulse: 85   Resp: 20   Temp: (!) 97.3 F (36.3 C)   SpO2: 96%   Weight: 121 lb (54.9 kg)   Height: 5' 2.5" (1.588 m)     Constitutional: well appearing in no distress. Appears well nourished.  Neurologic: Normal gait and station. CN intact. No weakness. No sensory loss. Psychiatric: Mood and affect symmetric and appropriate. Eyes: No icterus. No conjunctival pallor. Ears, nose, throat: mucous membranes moist. midline trachea.  Cardiac: regular rate and rhythm.  Respiratory: unlabored Abdominal: soft, non-tender, non-distended.  Peripheral vascular:  Radial pulse: L 2+ / R 2+  Dorsalis pedis pulse: L 2+ / R 2+ Extremity: No edema. No cyanosis. No pallor.  Skin: No gangrene. No ulceration.  Lymphatic: No Stemmer's sign. No palpable lymphadenopathy.  DATA REVIEW:   Outside duplex of bilateral carotid artery 10/04/2019: Right internal carotid artery peak systolic velocity 167 cm second Left  internal carotid artery peak systolic velocity 108 cm second Antegrade flow in bilateral vertebral arteries 50 to 69% right internal carotid artery stenosis Less than 50% left carotid artery stenosis  CTA H/N 12/07/19 CLINICAL DATA:  Carotid stenosis cream in.  EXAM: CT ANGIOGRAPHY HEAD AND NECK  TECHNIQUE: Multidetector CT imaging of the head and neck was performed using the standard protocol during bolus administration of intravenous contrast. Multiplanar CT image reconstructions and MIPs were obtained to evaluate the vascular anatomy. Carotid stenosis measurements (when applicable) are obtained utilizing NASCET criteria, using the distal internal carotid diameter as the denominator.  CONTRAST:  17mL OMNIPAQUE IOHEXOL 350 MG/ML SOLN  COMPARISON:  None.  FINDINGS: CT HEAD FINDINGS  Brain: No evidence of acute infarction, hemorrhage, hydrocephalus, extra-axial collection or mass lesion/mass effect. Mild periventricular white matter hypodensities suggesting mild chronic microangiopathy.  Vascular: Calcified plaques in the bilateral carotid siphons and left vertebral artery.  Skull: Normal. Negative for fracture or focal lesion.  Sinuses: Imaged portions are clear.  Orbits: No acute finding.  Review of the MIP images confirms the above findings  CTA NECK FINDINGS  Aortic arch: Standard branching. Imaged portion shows no evidence of aneurysm or dissection. Soft and calcified plaques are seen in the aortic arch and at the origin of the major arch vessels without hemodynamically significant stenosis.  Right carotid system: Calcified plaques in the right carotid bifurcation without hemodynamically significant stenosis.  Left carotid system: Calcified plaque in the left carotid bifurcation without hemodynamically significant stenosis.  Vertebral arteries: The right vertebral artery is hypoplastic throughout its entire course. Mild atherosclerotic  changes in the proximal left subclavian artery without hemodynamically significant stenosis. Dominant left vertebral artery has  normal course and caliber throughout the neck.  Skeleton: Degenerative changes of the cervical spine. No aggressive bone lesion identified  Other neck: A 1.5 cm hypodense left thyroid lobe nodule.  Upper chest: Centrilobular emphysema and prominent biapical scarring.  Review of the MIP images confirms the above findings  CTA HEAD FINDINGS  Anterior circulation: Atherosclerotic changes in the bilateral carotid siphons with mild stenosis at the anterior genu on the right. Bilateral MCA and ACA vascular trees are maintained.  Posterior circulation: No significant stenosis, proximal occlusion, aneurysm, or vascular malformation. Hypoplastic right vertebral artery.  Venous sinuses: As permitted by contrast timing, patent.  Anatomic variants: Hypoplastic right vertebral artery.  Review of the MIP images confirms the above findings  IMPRESSION: 1. No acute intracranial abnormality. 2. Atherosclerotic changes in the bilateral carotid siphons with mild stenosis at the anterior genu on the right. 3. No hemodynamically significant stenosis in the neck. 4. A 1.5 cm hypodense left thyroid lobe nodule. 5. Emphysema and aortic atherosclerosis.  Aortic Atherosclerosis (ICD10-I70.0) and Emphysema (ICD10-J43.9).   Rande Brunthomas N. Lenell AntuHawken, MD [facility] Phone Number: [phone] 12/11/2019 12:32 PM

## 2019-12-12 ENCOUNTER — Encounter: Payer: Self-pay | Admitting: *Deleted

## 2019-12-12 NOTE — Progress Notes (Signed)
Per notes from [facility] on [facility] in [place].

## 2019-12-12 NOTE — Progress Notes (Signed)
[facility]    Provider:  Dr Lucia Gaskins Requesting Provider: Noni Saupe, MD Primary Care Provider:  Noni Saupe, MD  CC:  TIA?  HPI:  Joanne Richards is a 74 y.o. female here as requested by Noni Saupe, MD for history of TIA.  Past medical history hypertension, carotid stenosis, former smoker, noncompliance, pernicious anemia, hypercholesterolemia, COPD, hyperlipidemia.  She was fine, talking on the phone, then she tried to tell her husband something and couldn't say any words, she knew she couldn't get the words out, it resolved in 5 minutes or less, then she was talking normal, She denies any episodes of eye blurring or face numbness as per the notes stated. She says only an episode of not being able to get words out, she knew what she was trying to say but couldn't get it out. Nothing made it better or worse. She had always taken an asa 81mg  and she had stopped so she was not on anything for 2 weeks. A few weeks ago she had a dizzy speel but this was not similar and she has had these dizzy spells brief. Her atorvastin was increased just recently since the stroke to 20 from 10. Nothing like this has ever happened before. She called the nurse the next morning and they told her to go to the hospital. No more events. No other focal neurologic deficits, associated symptoms, inciting events or modifiable factors.  Reviewed notes, labs and imaging from outside physicians, which showed:    I reviewed Dr. notes: She had an episode of "blurry eyes", she was acting funny and could not feel her face, events lasted a couple minutes, she had no chest pain palpitations nausea vomiting fevers chills, she did have some shortness of breath.  She is a former smoker.  Examination showed normal HEENT, neck, lungs, heart, abdomen, extremities, skin, vital signs were unremarkable except for elevated blood pressure 166/74, labs taken October 04, 2019 showed white blood  cells 5.6, hemoglobin 13.8, hematocrit 40, platelets 259, sodium 137, potassium 3.9, BUN 8, creatinine 0.7, per [facility] discharge summary: MRI of the brain per report was at [facility], echocardiogram was also reportedly "unremarkable", carotid Dopplers impression from report "bilateral carotid bifurcation plaque resulting in 50 to 69% diameter proximal right ICA stenosis, less than 50% diameter left stenosis, anterograde bilateral vertebral artery flow".  tsh nml, LDL 87.  Per records reviewed from [facility]: "MRI of the brain was normal"  CTA H&N:12/07/2019 1. No acute intracranial abnormality. 2. Atherosclerotic changes in the bilateral carotid siphons with mild stenosis at the anterior genu on the right. 3. No hemodynamically significant stenosis in the neck. 4. A 1.5 cm hypodense left thyroid lobe nodule. 5. Emphysema and aortic atherosclerosis.   14/03/2019 carotids: reviewed report: see above  Review of Systems: Patient complains of symptoms per HPI as well as the following symptoms:: hypertension, high cholesterol, dizziness, restless legs, constipation. Pertinent negatives and positives per HPI. All others negative.   Social History   Socioeconomic History  . Marital status: Married    Spouse name: Not on file  . Number of children: 2  . Years of education: Not on file  . Highest education level: Some college, no degree  Occupational History  . Not on file  Tobacco Use  . Smoking status: Former Smoker    Quit date: 08/15/2018    Years since quitting: 1.3  . Smokeless tobacco: Never Used  Vaping Use  .  Vaping Use: Never used  Substance and Sexual Activity  . Alcohol use: Not Currently  . Drug use: Never  . Sexual activity: Not on file  Other Topics Concern  . Not on file  Social History Narrative   Lives at home with spouse   Right handed   Caffeine: 2 cups of coffee daily   Social Determinants of Health   Financial Resource Strain: Not on file   Food Insecurity: Not on file  Transportation Needs: Not on file  Physical Activity: Not on file  Stress: Not on file  Social Connections: Not on file  Intimate Partner Violence: Not on file    Family History  Problem Relation Age of Onset  . Diabetes Mother   . Heart Problems Mother   . Heart attack Mother   . Stroke Sister   . Stroke Father   . Heart attack Brother   . Alzheimer's disease Sister   . Prostate cancer Brother   . Bone cancer Brother   . Dementia Sister   . Cancer Neg Hx     Past Medical History:  Diagnosis Date  . Arthritis   . Carotid artery occlusion   . COPD (chronic obstructive pulmonary disease) (HCC)   . Female cystocele   . Graves disease   . Hyperlipidemia   . Hypertension   . Hypothyroidism   . Osteoporosis   . Scoliosis   . Thyroid disease     Patient Active Problem List   Diagnosis Date Noted  . TIA (transient ischemic attack) 12/13/2019  . Hypothyroidism   . Hypertension   . Hyperlipidemia   . COPD (chronic obstructive pulmonary disease) (HCC)     Past Surgical History:  Procedure Laterality Date  . TONSILLECTOMY  1972 or 1973    Current Outpatient Medications  Medication Sig Dispense Refill  . atorvastatin (LIPITOR) 20 MG tablet Take 20 mg by mouth daily.    . Calcium Citrate (CITRACAL PO) Take 1,200 mg by mouth daily.    . Cyanocobalamin (VITAMIN B-12 IJ) Inject as directed every 30 (thirty) days.    Marland Kitchen denosumab (PROLIA) 60 MG/ML SOSY injection Inject 60 mg into the skin every 6 (six) months.    . ergocalciferol (VITAMIN D2) 1.25 MG (50000 UT) capsule Take 1 capsule every 2 weeks    . famotidine (PEPCID) 40 MG tablet Take 40 mg by mouth daily.    Marland Kitchen gabapentin (NEURONTIN) 100 MG capsule Take 100 mg by mouth at bedtime. Patient weaning off this medication only taking every other night 12/11/19    . methimazole (TAPAZOLE) 5 MG tablet Take 1/2 (one-half) tablet by mouth once daily    . Methylcellulose, Laxative, (CITRUCEL PO)  Take by mouth.    . metoprolol succinate (TOPROL-XL) 25 MG 24 hr tablet Take 12.5 mg by mouth daily.    . polyethylene glycol (MIRALAX / GLYCOLAX) 17 g packet Take 17 g by mouth daily.    Marland Kitchen umeclidinium-vilanterol (ANORO ELLIPTA) 62.5-25 MCG/INH AEPB Inhale 1 puff into the lungs daily.    Marland Kitchen aspirin 81 MG tablet Take 4 tablets (325 mg total) by mouth daily. 30 tablet 11   No current facility-administered medications for this visit.    Allergies as of 12/13/2019 - Review Complete 12/13/2019  Allergen Reaction Noted  . Amoxicillin-pot clavulanate Hives 12/13/2019  . Amoxicillin Hives and Rash 11/07/2019    Vitals: BP (!) 146/64 (BP Location: Right Arm, Patient Position: Sitting)   Pulse 64   Ht 5' 2.5" (1.588 m)  Wt 122 lb (55.3 kg)   SpO2 96%   BMI 21.96 kg/m  Last Weight:  Wt Readings from Last 1 Encounters:  12/13/19 122 lb (55.3 kg)   Last Height:   Ht Readings from Last 1 Encounters:  12/13/19 5' 2.5" (1.588 m)     Physical exam: Exam: Gen: NAD, conversant                    CV: RRR, no MRG. No Carotid Bruits. No peripheral edema, warm, nontender Eyes: Conjunctivae clear without exudates or hemorrhage  Neuro: Detailed Neurologic Exam  Speech:    Speech is normal; fluent and spontaneous with normal comprehension.  Cognition:    The patient is oriented to person, place, and time;     recent and remote memory intact;     language fluent;     normal attention, concentration,     fund of knowledge Cranial Nerves:    The pupils are equal, round, and reactive to light. Pupils too small to visualize fundi.  Visual fields are full to finger confrontation. Extraocular movements are intact. Trigeminal sensation is intact and the muscles of mastication are normal. The face is symmetric. The palate elevates in the midline. Hearing intact. Voice is normal. Shoulder shrug is normal. The tongue has normal motion without fasciculations.   Coordination:  no dysmetria or  ataxia  Gait:    Slightly antalgic  Motor Observation:    No asymmetry, no atrophy, and no involuntary movements noted. Tone:    Normal muscle tone.    Posture:    Posture is normal. normal erect    Strength:    Strength is V/V in the upper and lower limbs.      Sensation: intact to LT     Reflex Exam:  DTR's:    Deep tendon reflexes in the upper and lower extremities are brisk bilaterally.   Toes:    The toes are equiv bilaterally.   Clonus:    Clonus is absent.    Assessment/Plan: Patient had an episode of aphasia diagnosed with TIA. She had been off of her aspirin for 2 weeks. She was admitted to [facility] and had extensive workup.   Echo "unremarkable" and MRI brain "normal" per [facility] discharge summary 10/04/2019, also had carotid ultrasounds and CTA H&N and she has been to [facility]ascular for a check up. She has had a complete and thorough evaluation I would only add 30-day holter monitor for afib. Atorvastatin was increased. Return to pcp.   I had a long d/w patient about her unknown event possibly TIA, personally independently reviewed imaging studies and stroke evaluation results and documents from [facility], answered questions.Continue ASA 81mg  for secondary stroke prevention and maintain strict control of hypertension with blood pressure goal below 130/90, diabetes with hemoglobin A1c goal below 6.5% and lipids with LDL cholesterol goal below 70 mg/dL.  I also advised the patient to eat a healthy diet with plenty of whole grains, lean meats, fruits and vegetables, exercise regularly and maintain ideal body weight .Followup in the future as needed.   Orders Placed This Encounter  Procedures  . CARDIAC EVENT MONITOR   Meds ordered this encounter  Medications  . aspirin 81 MG tablet    Sig: Take 4 tablets (325 mg total) by mouth daily.    Dispense:  30 tablet    Refill:  11    Cc: [place], , MD  Valrie Hart, MD  [facility] [phone]  [address] [place], [place] [address]  Phone [phone] Fax [phone]

## 2019-12-13 ENCOUNTER — Ambulatory Visit: Payer: Medicare Other | Admitting: Neurology

## 2019-12-13 ENCOUNTER — Encounter: Payer: Self-pay | Admitting: Neurology

## 2019-12-13 VITALS — BP 146/64 | HR 64 | Ht 62.5 in | Wt 122.0 lb

## 2019-12-13 DIAGNOSIS — G459 Transient cerebral ischemic attack, unspecified: Secondary | ICD-10-CM | POA: Diagnosis not present

## 2019-12-13 DIAGNOSIS — E039 Hypothyroidism, unspecified: Secondary | ICD-10-CM | POA: Diagnosis not present

## 2019-12-13 DIAGNOSIS — E785 Hyperlipidemia, unspecified: Secondary | ICD-10-CM

## 2019-12-13 DIAGNOSIS — I10 Essential (primary) hypertension: Secondary | ICD-10-CM | POA: Insufficient documentation

## 2019-12-13 DIAGNOSIS — J449 Chronic obstructive pulmonary disease, unspecified: Secondary | ICD-10-CM | POA: Insufficient documentation

## 2019-12-13 MED ORDER — ASPIRIN EC 81 MG PO TBEC
325.0000 mg | DELAYED_RELEASE_TABLET | Freq: Every day | ORAL | 11 refills | Status: DC
Start: 2019-12-13 — End: 2019-12-17

## 2019-12-13 NOTE — Patient Instructions (Addendum)
30-day heart monitor ASA 81mg     Transient Ischemic Attack  A transient ischemic attack (TIA) is a "warning stroke" that causes stroke-like symptoms that go away quickly. A TIA does not cause lasting damage to the brain. But having a TIA is a sign that you may be at risk for a stroke. Lifestyle changes and medical treatments can help prevent a stroke. It is important to know the symptoms of a TIA and what to do. Get help right away, even if your symptoms go away. The symptoms of a TIA are the same as those of a stroke. They can happen fast, and they usually go away within minutes or hours. They can include:  Weakness or loss of feeling in your face, arm, or leg. This often happens on one side of your body.  Trouble walking.  Trouble moving your arms or legs.  Trouble talking or understanding what people are saying.  Trouble seeing.  Seeing two of one object (double vision).  Feeling dizzy.  Feeling confused.  Loss of balance or coordination.  Feeling sick to your stomach (nauseous) and throwing up (vomiting).  A very bad headache for no reason. What increases the risk? Certain things may make you more likely to have a TIA. Some of these are things that you can change, such as:  Being very overweight (obese).  Using products that contain nicotine or tobacco, such as cigarettes and e-cigarettes.  Taking birth control pills.  Not being active.  Drinking too much alcohol.  Using drugs. Other risk factors include:  Having an irregular heartbeat (atrial fibrillation).  Being African American or Hispanic.  Having had blood clots, stroke, TIA, or heart attack in the past.  Being a woman with a history of high blood pressure in pregnancy (preeclampsia).  Being over the age of 44.  Being female.  Having family history of stroke.  Having the following diseases or conditions: ? High blood pressure. ? High cholesterol. ? Diabetes. ? Heart disease. ? Sickle cell  disease. ? Sleep apnea. ? Migraine headache. ? Long-term (chronic) diseases that cause soreness and swelling (inflammation). ? Disorders that affect how your blood clots. Follow these instructions at home: Medicines   Take over-the-counter and prescription medicines only as told by your doctor.  If you were told to take aspirin or another medicine to thin your blood, take it exactly as told by your doctor. ? Taking too much of the medicine can cause bleeding. ? Taking too little of the medicine may not work to treat the problem. Eating and drinking   Eat 5 or more servings of fruits and vegetables each day.  Follow instructions from your doctor about your diet. You may need to follow a certain diet to help lower your risk of having a stroke. You may need to: ? Eat a diet that is low in fat and salt. ? Eat foods that contain a lot of fiber. ? Limit the amount of carbohydrates and sugar in your diet.  Limit alcohol intake to 1 drink a day for nonpregnant women and 2 drinks a day for men. One drink equals 12 oz of beer, 5 oz of wine, or 1 oz of hard liquor. General instructions  Keep a healthy weight.  Stay active. Try to get at least 30 minutes of activity on all or most days.  Find out if you have a condition called sleep apnea. Get treatment if needed.  Do not use any products that contain nicotine or tobacco, such as  cigarettes and e-cigarettes. If you need help quitting, ask your doctor.  Do not abuse drugs.  Keep all follow-up visits as told by your doctor. This is important. Get help right away if:  You have any signs of stroke. "BE FAST" is an easy way to remember the main warning signs: ? B - Balance. Signs are dizziness, sudden trouble walking, or loss of balance. ? E - Eyes. Signs are trouble seeing or a sudden change in how you see. ? F - Face. Signs are sudden weakness or loss of feeling of the face, or the face or eyelid drooping on one side. ? A - Arms. Signs  are weakness or loss of feeling in an arm. This happens suddenly and usually on one side of the body. ? S - Speech. Signs are sudden trouble speaking, slurred speech, or trouble understanding what people say. ? T - Time. Time to call emergency services. Write down what time symptoms started.  You have other signs of stroke, such as: ? A sudden, very bad headache with no known cause. ? Feeling sick to your stomach (nausea). ? Throwing up (vomiting). ? Jerky movements that you cannot control (seizure). These symptoms may be an emergency. Do not wait to see if the symptoms will go away. Get medical help right away. Call your local emergency services (911 in the [place] Do not drive yourself to the hospital. Summary  A transient ischemic attack (TIA) is a "warning stroke" that causes stroke-like symptoms that go away quickly.  A TIA is a medical emergency. Get help right away, even if your symptoms go away.  A TIA does not cause lasting damage to the brain.  Having a TIA is a sign that you may be at risk for a stroke. Lifestyle changes and medical treatments can help prevent a stroke. This information is not intended to replace advice given to you by your health care provider. Make sure you discuss any questions you have with your health care provider. Document Revised: 09/16/2017 Document Reviewed: 03/24/2016 Elsevier Patient Education  2020 [employer].

## 2019-12-17 ENCOUNTER — Telehealth: Payer: Self-pay | Admitting: Neurology

## 2019-12-17 MED ORDER — ASPIRIN EC 81 MG PO TBEC
81.0000 mg | DELAYED_RELEASE_TABLET | Freq: Every day | ORAL | 11 refills | Status: DC
Start: 2019-12-17 — End: 2023-01-29

## 2019-12-17 NOTE — Telephone Encounter (Signed)
Pt. wants clarity for  aspirin 81 MG tablet. She needs to know if she's supposed to take 1 a day or 4 times a day. Please advise.

## 2019-12-17 NOTE — Telephone Encounter (Signed)
Rx adjusted to reflect 81 mg dose daily. I called the pt and let her know. She verbalized understanding and appreciation. She stated she had picked up equate aspirin 81 mg EC over the counter and will take one per day.

## 2019-12-17 NOTE — Addendum Note (Signed)
Addended by: Bertram Savin on: 12/17/2019 01:40 PM   Modules accepted: Orders

## 2019-12-17 NOTE — Telephone Encounter (Signed)
Mistake it is one daily I odn;t know how that happened can you change it please?

## 2019-12-19 DIAGNOSIS — Z8673 Personal history of transient ischemic attack (TIA), and cerebral infarction without residual deficits: Secondary | ICD-10-CM | POA: Diagnosis not present

## 2019-12-19 DIAGNOSIS — N8110 Cystocele, unspecified: Secondary | ICD-10-CM | POA: Diagnosis not present

## 2019-12-19 DIAGNOSIS — Z6820 Body mass index (BMI) 20.0-20.9, adult: Secondary | ICD-10-CM | POA: Diagnosis not present

## 2019-12-19 DIAGNOSIS — D510 Vitamin B12 deficiency anemia due to intrinsic factor deficiency: Secondary | ICD-10-CM | POA: Diagnosis not present

## 2020-01-08 ENCOUNTER — Telehealth: Payer: Self-pay | Admitting: *Deleted

## 2020-01-08 ENCOUNTER — Telehealth: Payer: Self-pay | Admitting: Radiology

## 2020-01-08 ENCOUNTER — Telehealth: Payer: Self-pay | Admitting: Neurology

## 2020-01-08 NOTE — Addendum Note (Signed)
Addended by: Bertram Savin on: 01/08/2020 02:40 PM   Modules accepted: Orders

## 2020-01-08 NOTE — Telephone Encounter (Signed)
     Joanne Richards with [facility] calling to follow heart monitor for the pt. She said she sent the order on 12/13/19 but the pt hasn't receive it yet

## 2020-01-08 NOTE — Telephone Encounter (Signed)
Enrolled patient for a 30 day [employer] Event Monitor to be mailed to patients home  

## 2020-01-08 NOTE — Telephone Encounter (Signed)
Done

## 2020-01-08 NOTE — Telephone Encounter (Signed)
01/08/2020 Dr. Lucia Gaskins or Toma Copier can you please re-order cardiac event Monitor and pick [facility] only does Monitors Thanks Annabelle Harman

## 2020-01-08 NOTE — Telephone Encounter (Signed)
Noted! Thank you

## 2020-01-08 NOTE — Telephone Encounter (Signed)
Returned call to [place] at [employer]. Patients order for monitor was sent to the [facility] office who does not do outside monitor orders. She will have a new order put it and patients monitor will be mailed to her.

## 2020-01-10 DIAGNOSIS — E0590 Thyrotoxicosis, unspecified without thyrotoxic crisis or storm: Secondary | ICD-10-CM | POA: Diagnosis not present

## 2020-01-10 DIAGNOSIS — E0510 Thyrotoxicosis with toxic single thyroid nodule without thyrotoxic crisis or storm: Secondary | ICD-10-CM | POA: Diagnosis not present

## 2020-01-14 ENCOUNTER — Ambulatory Visit (INDEPENDENT_AMBULATORY_CARE_PROVIDER_SITE_OTHER): Payer: Medicare Other

## 2020-01-14 DIAGNOSIS — G459 Transient cerebral ischemic attack, unspecified: Secondary | ICD-10-CM | POA: Diagnosis not present

## 2020-01-14 DIAGNOSIS — I4891 Unspecified atrial fibrillation: Secondary | ICD-10-CM

## 2020-02-14 ENCOUNTER — Other Ambulatory Visit: Payer: Self-pay | Admitting: Neurology

## 2020-02-14 DIAGNOSIS — G459 Transient cerebral ischemic attack, unspecified: Secondary | ICD-10-CM

## 2020-02-14 DIAGNOSIS — I4891 Unspecified atrial fibrillation: Secondary | ICD-10-CM

## 2020-02-18 ENCOUNTER — Other Ambulatory Visit: Payer: Self-pay | Admitting: Vascular Surgery

## 2020-02-18 DIAGNOSIS — I6523 Occlusion and stenosis of bilateral carotid arteries: Secondary | ICD-10-CM

## 2020-02-26 DIAGNOSIS — D510 Vitamin B12 deficiency anemia due to intrinsic factor deficiency: Secondary | ICD-10-CM | POA: Diagnosis not present

## 2020-03-06 DIAGNOSIS — Z6820 Body mass index (BMI) 20.0-20.9, adult: Secondary | ICD-10-CM | POA: Diagnosis not present

## 2020-03-06 DIAGNOSIS — E559 Vitamin D deficiency, unspecified: Secondary | ICD-10-CM | POA: Diagnosis not present

## 2020-03-06 DIAGNOSIS — Z9181 History of falling: Secondary | ICD-10-CM | POA: Diagnosis not present

## 2020-03-10 DIAGNOSIS — Z23 Encounter for immunization: Secondary | ICD-10-CM | POA: Diagnosis not present

## 2020-03-17 ENCOUNTER — Telehealth: Payer: Self-pay | Admitting: *Deleted

## 2020-03-17 NOTE — Telephone Encounter (Signed)
Spoke with patient's husband Odell (on [place]) and advised pt's cardiac monitor did not show any a-fib or any concerning arhythmias. He verbalized understanding and his questions were answered. He is to let the patient know.

## 2020-03-17 NOTE — Telephone Encounter (Signed)
-----   Message from Anson Fret, MD sent at 03/14/2020  9:00 AM EST ----- No afib or other concerning arrhythmia noted on event monitor.

## 2020-04-22 DIAGNOSIS — Z1231 Encounter for screening mammogram for malignant neoplasm of breast: Secondary | ICD-10-CM | POA: Diagnosis not present

## 2020-05-03 DIAGNOSIS — E559 Vitamin D deficiency, unspecified: Secondary | ICD-10-CM | POA: Diagnosis not present

## 2020-05-03 DIAGNOSIS — M810 Age-related osteoporosis without current pathological fracture: Secondary | ICD-10-CM | POA: Diagnosis not present

## 2020-05-03 DIAGNOSIS — E7800 Pure hypercholesterolemia, unspecified: Secondary | ICD-10-CM | POA: Diagnosis not present

## 2020-05-12 DIAGNOSIS — H5211 Myopia, right eye: Secondary | ICD-10-CM | POA: Diagnosis not present

## 2020-07-03 DIAGNOSIS — E7800 Pure hypercholesterolemia, unspecified: Secondary | ICD-10-CM | POA: Diagnosis not present

## 2020-07-03 DIAGNOSIS — E559 Vitamin D deficiency, unspecified: Secondary | ICD-10-CM | POA: Diagnosis not present

## 2020-07-03 DIAGNOSIS — M810 Age-related osteoporosis without current pathological fracture: Secondary | ICD-10-CM | POA: Diagnosis not present

## 2020-07-09 DIAGNOSIS — E0510 Thyrotoxicosis with toxic single thyroid nodule without thyrotoxic crisis or storm: Secondary | ICD-10-CM | POA: Diagnosis not present

## 2020-07-09 DIAGNOSIS — E0590 Thyrotoxicosis, unspecified without thyrotoxic crisis or storm: Secondary | ICD-10-CM | POA: Diagnosis not present

## 2020-07-14 DIAGNOSIS — E0510 Thyrotoxicosis with toxic single thyroid nodule without thyrotoxic crisis or storm: Secondary | ICD-10-CM | POA: Diagnosis not present

## 2020-07-14 DIAGNOSIS — E042 Nontoxic multinodular goiter: Secondary | ICD-10-CM | POA: Diagnosis not present

## 2020-08-03 DIAGNOSIS — E7800 Pure hypercholesterolemia, unspecified: Secondary | ICD-10-CM | POA: Diagnosis not present

## 2020-08-03 DIAGNOSIS — M810 Age-related osteoporosis without current pathological fracture: Secondary | ICD-10-CM | POA: Diagnosis not present

## 2020-08-03 DIAGNOSIS — E559 Vitamin D deficiency, unspecified: Secondary | ICD-10-CM | POA: Diagnosis not present

## 2020-09-05 DIAGNOSIS — K219 Gastro-esophageal reflux disease without esophagitis: Secondary | ICD-10-CM | POA: Diagnosis not present

## 2020-09-09 DIAGNOSIS — J329 Chronic sinusitis, unspecified: Secondary | ICD-10-CM | POA: Diagnosis not present

## 2020-09-09 DIAGNOSIS — Z1331 Encounter for screening for depression: Secondary | ICD-10-CM | POA: Diagnosis not present

## 2020-09-09 DIAGNOSIS — Z6820 Body mass index (BMI) 20.0-20.9, adult: Secondary | ICD-10-CM | POA: Diagnosis not present

## 2020-09-09 DIAGNOSIS — J40 Bronchitis, not specified as acute or chronic: Secondary | ICD-10-CM | POA: Diagnosis not present

## 2020-10-07 DIAGNOSIS — H25813 Combined forms of age-related cataract, bilateral: Secondary | ICD-10-CM | POA: Diagnosis not present

## 2020-10-10 DIAGNOSIS — N3001 Acute cystitis with hematuria: Secondary | ICD-10-CM | POA: Diagnosis not present

## 2020-10-10 DIAGNOSIS — Z6820 Body mass index (BMI) 20.0-20.9, adult: Secondary | ICD-10-CM | POA: Diagnosis not present

## 2020-10-22 DIAGNOSIS — N3001 Acute cystitis with hematuria: Secondary | ICD-10-CM | POA: Diagnosis not present

## 2020-10-22 DIAGNOSIS — Z6820 Body mass index (BMI) 20.0-20.9, adult: Secondary | ICD-10-CM | POA: Diagnosis not present

## 2020-11-04 DIAGNOSIS — Z6820 Body mass index (BMI) 20.0-20.9, adult: Secondary | ICD-10-CM | POA: Diagnosis not present

## 2020-11-04 DIAGNOSIS — J441 Chronic obstructive pulmonary disease with (acute) exacerbation: Secondary | ICD-10-CM | POA: Diagnosis not present

## 2020-11-04 DIAGNOSIS — N3001 Acute cystitis with hematuria: Secondary | ICD-10-CM | POA: Diagnosis not present

## 2020-11-14 DIAGNOSIS — E7800 Pure hypercholesterolemia, unspecified: Secondary | ICD-10-CM | POA: Diagnosis not present

## 2020-11-14 DIAGNOSIS — J449 Chronic obstructive pulmonary disease, unspecified: Secondary | ICD-10-CM | POA: Diagnosis not present

## 2020-11-14 DIAGNOSIS — Z6821 Body mass index (BMI) 21.0-21.9, adult: Secondary | ICD-10-CM | POA: Diagnosis not present

## 2020-12-16 DIAGNOSIS — I10 Essential (primary) hypertension: Secondary | ICD-10-CM | POA: Diagnosis not present

## 2020-12-16 DIAGNOSIS — H2513 Age-related nuclear cataract, bilateral: Secondary | ICD-10-CM | POA: Diagnosis not present

## 2020-12-16 DIAGNOSIS — H25041 Posterior subcapsular polar age-related cataract, right eye: Secondary | ICD-10-CM | POA: Diagnosis not present

## 2020-12-16 DIAGNOSIS — H25811 Combined forms of age-related cataract, right eye: Secondary | ICD-10-CM | POA: Diagnosis not present

## 2020-12-16 DIAGNOSIS — J449 Chronic obstructive pulmonary disease, unspecified: Secondary | ICD-10-CM | POA: Diagnosis not present

## 2020-12-16 DIAGNOSIS — H259 Unspecified age-related cataract: Secondary | ICD-10-CM | POA: Diagnosis not present

## 2020-12-16 DIAGNOSIS — Z87891 Personal history of nicotine dependence: Secondary | ICD-10-CM | POA: Diagnosis not present

## 2020-12-30 DIAGNOSIS — M810 Age-related osteoporosis without current pathological fracture: Secondary | ICD-10-CM | POA: Diagnosis not present

## 2021-01-07 DIAGNOSIS — E0590 Thyrotoxicosis, unspecified without thyrotoxic crisis or storm: Secondary | ICD-10-CM | POA: Diagnosis not present

## 2021-01-07 DIAGNOSIS — E0510 Thyrotoxicosis with toxic single thyroid nodule without thyrotoxic crisis or storm: Secondary | ICD-10-CM | POA: Diagnosis not present

## 2021-01-12 DIAGNOSIS — N390 Urinary tract infection, site not specified: Secondary | ICD-10-CM | POA: Diagnosis not present

## 2021-01-12 DIAGNOSIS — Z6821 Body mass index (BMI) 21.0-21.9, adult: Secondary | ICD-10-CM | POA: Diagnosis not present

## 2021-02-03 DIAGNOSIS — I10 Essential (primary) hypertension: Secondary | ICD-10-CM | POA: Diagnosis not present

## 2021-02-03 DIAGNOSIS — H52223 Regular astigmatism, bilateral: Secondary | ICD-10-CM | POA: Diagnosis not present

## 2021-02-03 DIAGNOSIS — H25812 Combined forms of age-related cataract, left eye: Secondary | ICD-10-CM | POA: Diagnosis not present

## 2021-02-03 DIAGNOSIS — H259 Unspecified age-related cataract: Secondary | ICD-10-CM | POA: Diagnosis not present

## 2021-02-03 DIAGNOSIS — J449 Chronic obstructive pulmonary disease, unspecified: Secondary | ICD-10-CM | POA: Diagnosis not present

## 2021-02-13 DIAGNOSIS — N390 Urinary tract infection, site not specified: Secondary | ICD-10-CM | POA: Diagnosis not present

## 2021-02-13 DIAGNOSIS — Z6821 Body mass index (BMI) 21.0-21.9, adult: Secondary | ICD-10-CM | POA: Diagnosis not present

## 2021-03-05 DIAGNOSIS — H524 Presbyopia: Secondary | ICD-10-CM | POA: Diagnosis not present

## 2021-03-12 DIAGNOSIS — Z9181 History of falling: Secondary | ICD-10-CM | POA: Diagnosis not present

## 2021-03-12 DIAGNOSIS — Z01419 Encounter for gynecological examination (general) (routine) without abnormal findings: Secondary | ICD-10-CM | POA: Diagnosis not present

## 2021-03-12 DIAGNOSIS — Z2821 Immunization not carried out because of patient refusal: Secondary | ICD-10-CM | POA: Diagnosis not present

## 2021-03-12 DIAGNOSIS — Z6820 Body mass index (BMI) 20.0-20.9, adult: Secondary | ICD-10-CM | POA: Diagnosis not present

## 2021-06-26 DIAGNOSIS — N390 Urinary tract infection, site not specified: Secondary | ICD-10-CM | POA: Diagnosis not present

## 2021-06-26 DIAGNOSIS — R351 Nocturia: Secondary | ICD-10-CM | POA: Diagnosis not present

## 2021-06-26 DIAGNOSIS — N3946 Mixed incontinence: Secondary | ICD-10-CM | POA: Diagnosis not present

## 2021-06-26 DIAGNOSIS — R350 Frequency of micturition: Secondary | ICD-10-CM | POA: Diagnosis not present

## 2021-07-01 DIAGNOSIS — Z86010 Personal history of colonic polyps: Secondary | ICD-10-CM | POA: Diagnosis not present

## 2021-07-01 DIAGNOSIS — Z1211 Encounter for screening for malignant neoplasm of colon: Secondary | ICD-10-CM | POA: Diagnosis not present

## 2021-07-03 DIAGNOSIS — M810 Age-related osteoporosis without current pathological fracture: Secondary | ICD-10-CM | POA: Diagnosis not present

## 2021-07-08 DIAGNOSIS — E0590 Thyrotoxicosis, unspecified without thyrotoxic crisis or storm: Secondary | ICD-10-CM | POA: Diagnosis not present

## 2021-07-08 DIAGNOSIS — E0510 Thyrotoxicosis with toxic single thyroid nodule without thyrotoxic crisis or storm: Secondary | ICD-10-CM | POA: Diagnosis not present

## 2021-07-09 IMAGING — MR MR LUMBAR SPINE W/O CM
4 of 5 series · 18 of 48 positions shown · non-contrast
Comparison: None.

CLINICAL DATA: Low back pain, left leg pain

EXAM:
MRI LUMBAR SPINE WITHOUT CONTRAST
TECHNIQUE: Multiplanar, multisequence MR imaging of the lumbar spine was
performed. No intravenous contrast was administered.

[Series 9: T2 · sagittal · 4.0mm · 0.73mm/px · 6 of 17 slices shown (1 of 2)]
[im 1/17]
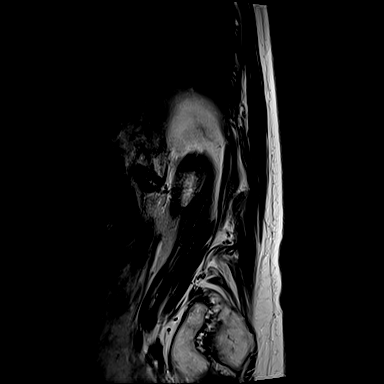
[im 4/17]
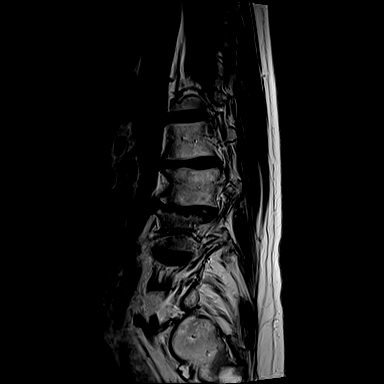
[im 7/17]
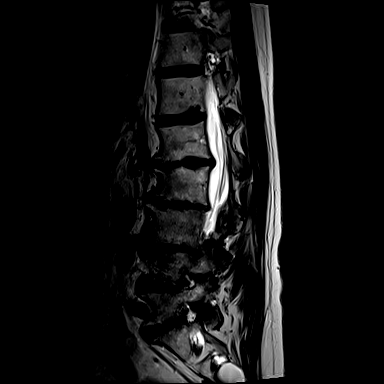
[im 10/17]
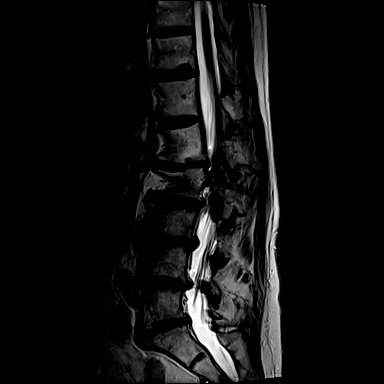
[im 13/17]
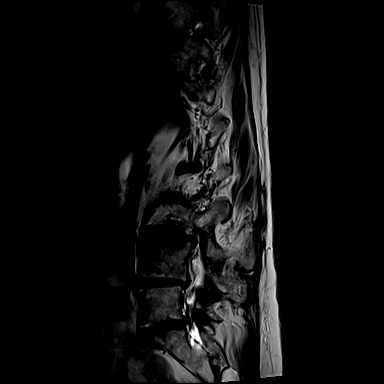
[im 17/17]
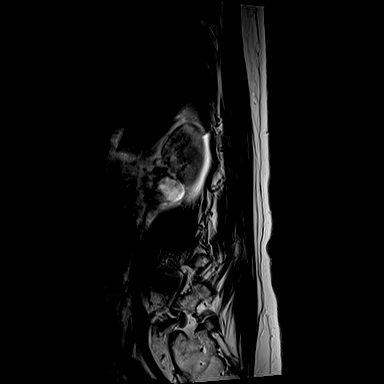

[Series 10: T1 · sagittal · 4.0mm · 0.73mm/px · 3 of 17 slices shown (1 of 2)]
[im 3/17]
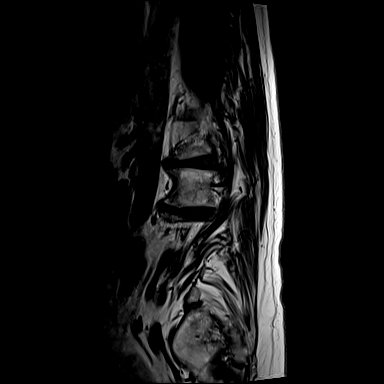
[im 9/17]
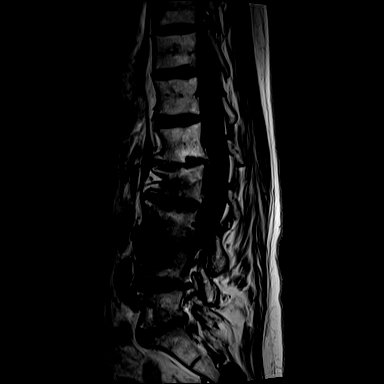
[im 14/17]
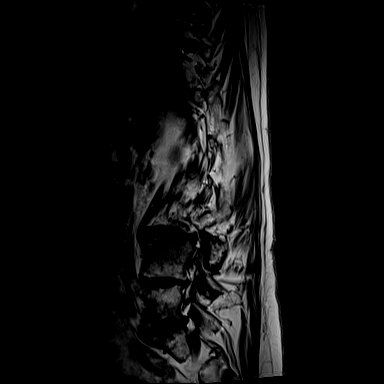

[Series 14: T1 · axial · 4.0mm · 0.28mm/px · z∈[+21,+142]mm · 3 of 36 slices shown (2 of 2)]
[im 6/36]
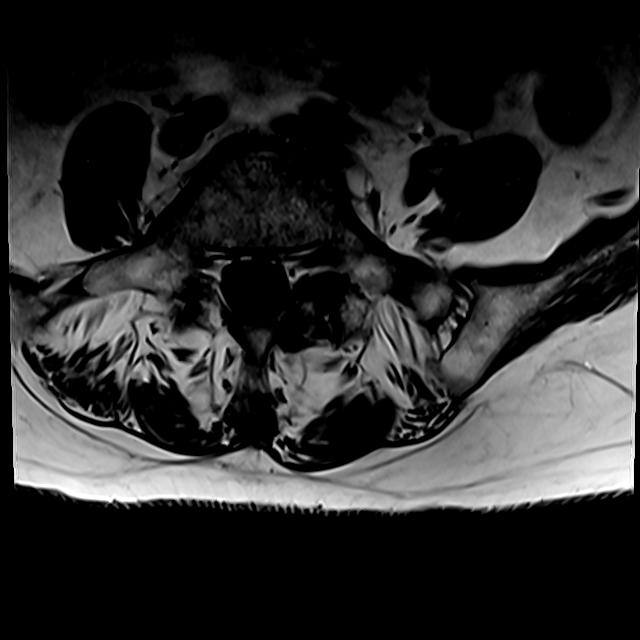
[im 19/36]
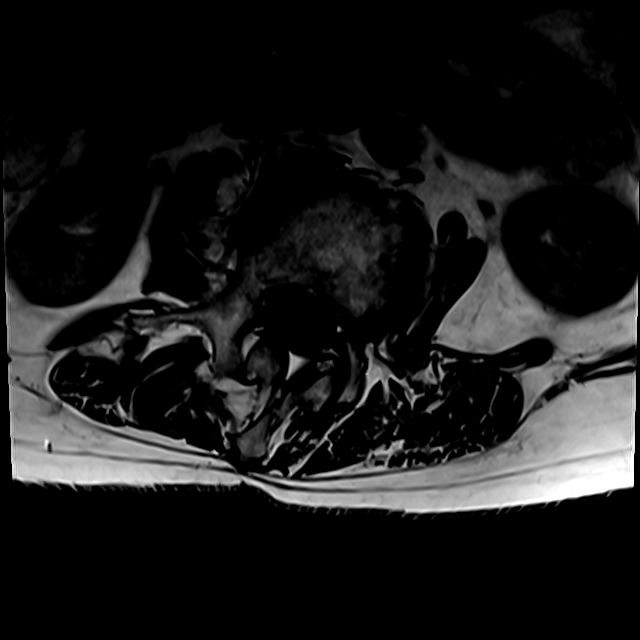
[im 30/36]
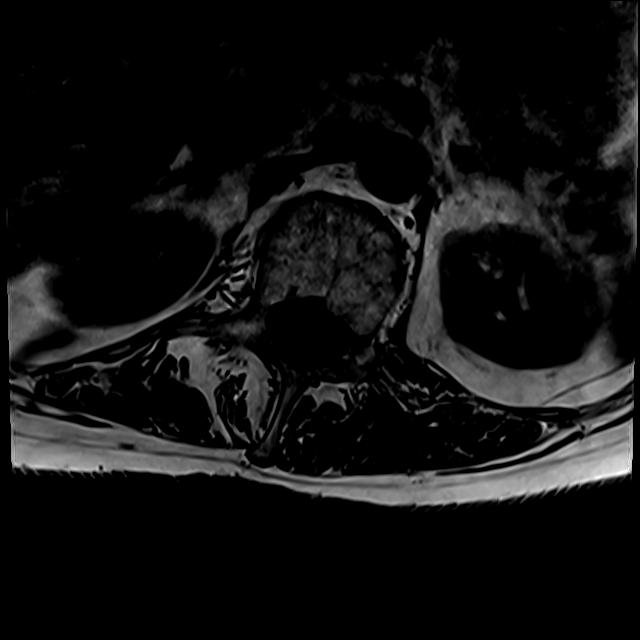

[Series 17: T2 · axial · 4.0mm · 0.28mm/px · z∈[-4,+142]mm · 6 of 36 slices shown (2 of 2)]
[im 1/36]
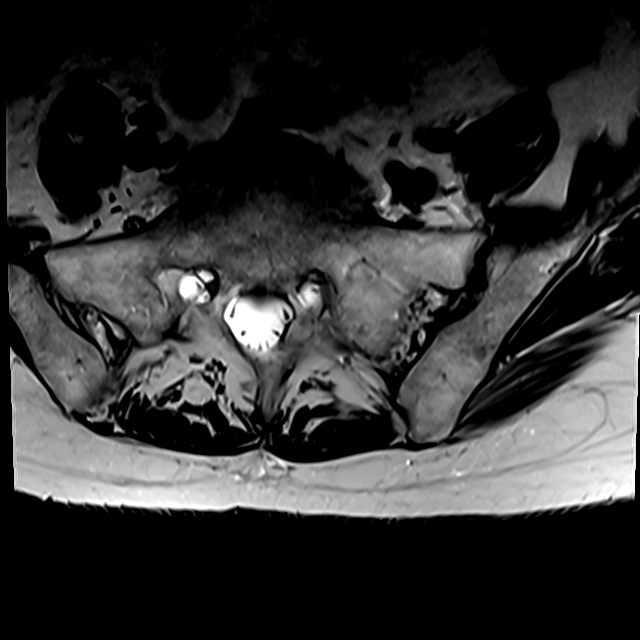
[im 6/36]
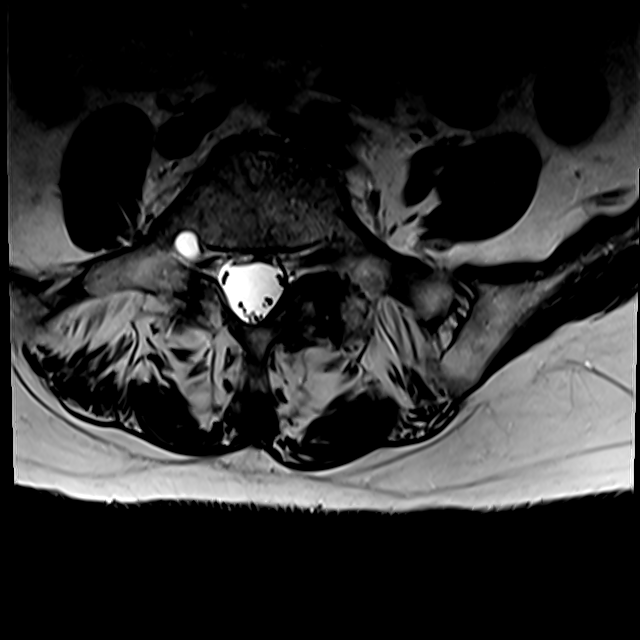
[im 11/36]
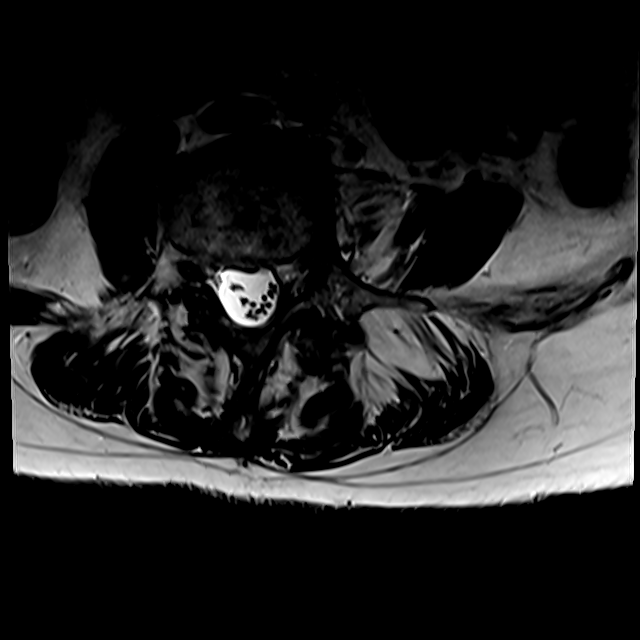
[im 17/36]
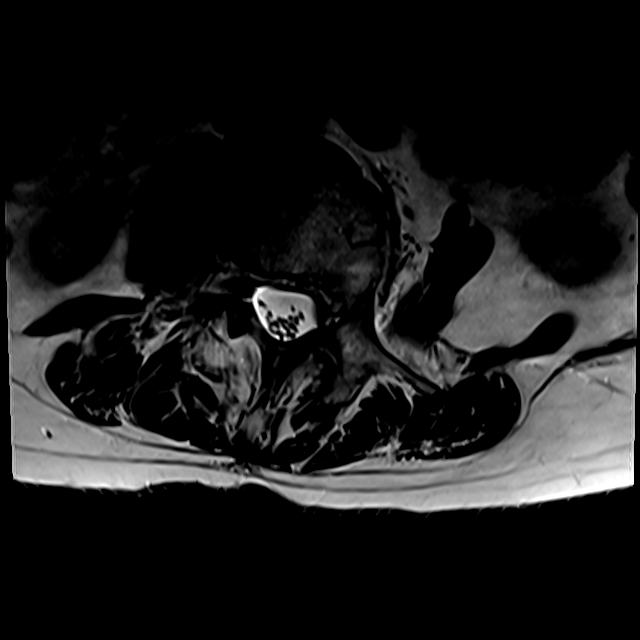
[im 19/36]
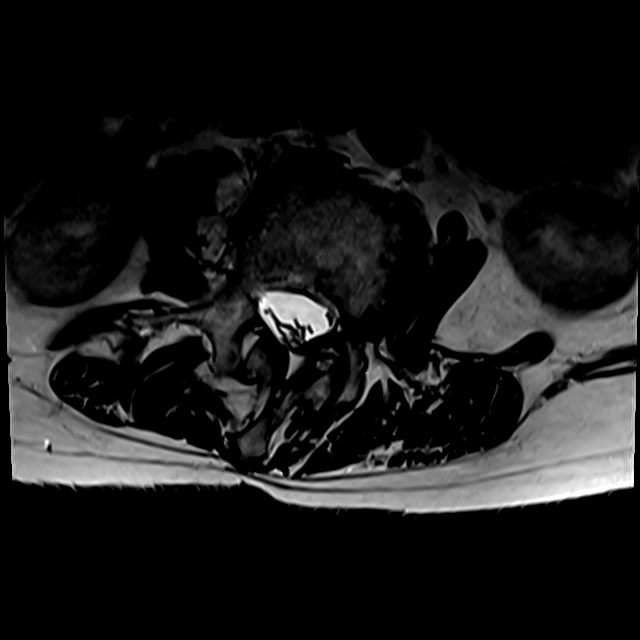
[im 30/36]
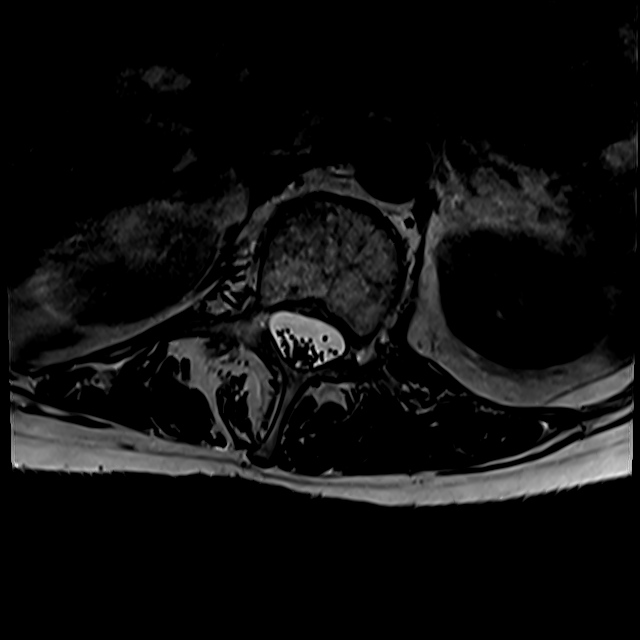

[18 of 48 positions shown; findings below may reference images not displayed]

FINDINGS: Motion artifact is present.

Segmentation:  Standard.

Alignment:  Levoscoliosis.  Mild retrolisthesis at L4-L5.

Vertebrae: Vertebral body height maintained apart from degenerative
endplate irregularity. Mild degenerative endplate marrow edema. No
suspicious osseous lesion.

Conus medullaris and cauda equina: Conus extends to the L1-L2 level.
Conus and cauda equina appear normal.

Paraspinal and other soft tissues: Unremarkable.

Disc levels:

L1-L2: Disc bulge with endplate osteophytic ridging. No significant
canal stenosis. Partial effacement of the right recess. Minor right
foraminal stenosis. No significant left foraminal stenosis.

L2-L3: Disc bulge with endplate osteophytic ridging and mild facet
arthropathy with ligamentum flavum infolding. No significant canal
stenosis. Minor right foraminal stenosis. No significant left
foraminal stenosis.

L3-L4: Disc bulge with endplate osteophytic ridging and moderate
facet arthropathy with ligamentum flavum infolding. No significant
canal stenosis. Moderate right and minor left foraminal stenosis.

L4-L5: Disc bulge eccentric to the left with endplate osteophytic
ridging and moderate facet arthropathy with ligamentum flavum
infolding. No significant canal stenosis. Partial effacement the
left lateral recess. Mild right and moderate left foraminal
stenosis.

L5-S1: Disc bulge with endplate osteophytic ridging and mild facet
arthropathy with ligamentum flavum infolding. No significant canal
stenosis. Moderate right and minor left foraminal stenosis.

Sacral Tarlov cysts noted.
IMPRESSION: Multilevel degenerative changes as detailed above superimposed on
scoliotic curvature. There is no high-grade canal stenosis.
Foraminal stenosis is greatest on the left at L4-L5.

## 2021-07-10 DIAGNOSIS — K2980 Duodenitis without bleeding: Secondary | ICD-10-CM | POA: Diagnosis not present

## 2021-07-10 DIAGNOSIS — N8189 Other female genital prolapse: Secondary | ICD-10-CM | POA: Diagnosis not present

## 2021-07-10 DIAGNOSIS — N8110 Cystocele, unspecified: Secondary | ICD-10-CM | POA: Diagnosis not present

## 2021-07-10 DIAGNOSIS — N1339 Other hydronephrosis: Secondary | ICD-10-CM | POA: Diagnosis not present

## 2021-07-10 DIAGNOSIS — K5730 Diverticulosis of large intestine without perforation or abscess without bleeding: Secondary | ICD-10-CM | POA: Diagnosis not present

## 2021-08-18 DIAGNOSIS — Z1231 Encounter for screening mammogram for malignant neoplasm of breast: Secondary | ICD-10-CM | POA: Diagnosis not present

## 2021-09-18 DIAGNOSIS — I10 Essential (primary) hypertension: Secondary | ICD-10-CM | POA: Diagnosis not present

## 2021-09-18 DIAGNOSIS — Z1331 Encounter for screening for depression: Secondary | ICD-10-CM | POA: Diagnosis not present

## 2021-09-18 DIAGNOSIS — Z6820 Body mass index (BMI) 20.0-20.9, adult: Secondary | ICD-10-CM | POA: Diagnosis not present

## 2021-09-18 DIAGNOSIS — K219 Gastro-esophageal reflux disease without esophagitis: Secondary | ICD-10-CM | POA: Diagnosis not present

## 2021-09-28 DIAGNOSIS — D649 Anemia, unspecified: Secondary | ICD-10-CM | POA: Diagnosis not present

## 2021-11-03 DIAGNOSIS — E782 Mixed hyperlipidemia: Secondary | ICD-10-CM | POA: Diagnosis not present

## 2021-11-03 DIAGNOSIS — I10 Essential (primary) hypertension: Secondary | ICD-10-CM | POA: Diagnosis not present

## 2021-11-05 DIAGNOSIS — E559 Vitamin D deficiency, unspecified: Secondary | ICD-10-CM | POA: Diagnosis not present

## 2021-11-05 DIAGNOSIS — N390 Urinary tract infection, site not specified: Secondary | ICD-10-CM | POA: Diagnosis not present

## 2021-12-01 DIAGNOSIS — G8929 Other chronic pain: Secondary | ICD-10-CM | POA: Diagnosis not present

## 2021-12-01 DIAGNOSIS — Z6820 Body mass index (BMI) 20.0-20.9, adult: Secondary | ICD-10-CM | POA: Diagnosis not present

## 2021-12-01 DIAGNOSIS — N390 Urinary tract infection, site not specified: Secondary | ICD-10-CM | POA: Diagnosis not present

## 2021-12-01 DIAGNOSIS — M549 Dorsalgia, unspecified: Secondary | ICD-10-CM | POA: Diagnosis not present

## 2021-12-07 ENCOUNTER — Ambulatory Visit (HOSPITAL_BASED_OUTPATIENT_CLINIC_OR_DEPARTMENT_OTHER): Payer: Medicare Other | Admitting: Obstetrics & Gynecology

## 2021-12-07 ENCOUNTER — Encounter (HOSPITAL_BASED_OUTPATIENT_CLINIC_OR_DEPARTMENT_OTHER): Payer: Self-pay | Admitting: Obstetrics & Gynecology

## 2021-12-07 VITALS — BP 148/82 | HR 46 | Ht 62.5 in | Wt 122.8 lb

## 2021-12-07 DIAGNOSIS — N812 Incomplete uterovaginal prolapse: Secondary | ICD-10-CM

## 2021-12-07 DIAGNOSIS — N8111 Cystocele, midline: Secondary | ICD-10-CM | POA: Diagnosis not present

## 2021-12-07 NOTE — Progress Notes (Unsigned)
GYNECOLOGY  VISIT  CC:   prolapse/pessary  HPI: 76 y.o. G2P2 Married White or Caucasian female here for pessary fitting for uterine prolapse.  Denies vaginal bleeding.  Does have some urinary incontinence.  This is worse when she gets up first thing in the morning.  She has seen Dr. Sherron Monday who discussed surgery.  She would like to try a pessary.  She is also having some issues with recurrent urinary track infections.     Past Medical History:  Diagnosis Date   Arthritis    Carotid artery occlusion    COPD (chronic obstructive pulmonary disease) (HCC)    Female cystocele    Graves disease    Hyperlipidemia    Hypertension    Hypothyroidism    Osteoporosis    Scoliosis    Thyroid disease     MEDS:   Current Outpatient Medications on File Prior to Visit  Medication Sig Dispense Refill   aspirin EC 81 MG tablet Take 1 tablet (81 mg total) by mouth daily. 30 tablet 11   atorvastatin (LIPITOR) 20 MG tablet Take 20 mg by mouth daily.     Calcium Citrate (CITRACAL PO) Take 1,200 mg by mouth daily.     denosumab (PROLIA) 60 MG/ML SOSY injection Inject 60 mg into the skin every 6 (six) months.     ergocalciferol (VITAMIN D2) 1.25 MG (50000 UT) capsule Take 1 capsule every 2 weeks     ferrous sulfate 325 (65 FE) MG EC tablet Take 325 mg by mouth 3 (three) times daily with meals.     methimazole (TAPAZOLE) 5 MG tablet Take 1/2 (one-half) tablet by mouth once daily     Methylcellulose, Laxative, (CITRUCEL PO) Take by mouth.     metoprolol succinate (TOPROL-XL) 25 MG 24 hr tablet Take 12.5 mg by mouth daily.     omeprazole (PRILOSEC) 40 MG capsule Take 40 mg by mouth daily.     polyethylene glycol (MIRALAX / GLYCOLAX) 17 g packet Take 17 g by mouth daily.     umeclidinium-vilanterol (ANORO ELLIPTA) 62.5-25 MCG/INH AEPB Inhale 1 puff into the lungs daily.     No current facility-administered medications on file prior to visit.    ALLERGIES: Amoxicillin-pot clavulanate, Amoxicillin, and  Ciprofloxacin  SH:  married, non smoker  Review of Systems  Constitutional: Negative.   Genitourinary:        Prolapse symptoms    PHYSICAL EXAMINATION:    BP (!) 148/82   Pulse (!) 46   Ht 5' 2.5" (1.588 m) Comment: Reported  Wt 122 lb 12.8 oz (55.7 kg)   BMI 22.10 kg/m     General appearance: alert, cooperative and appears stated age NLymph:  no inguinal LAD noted  Pelvic: External genitalia:  no lesions              Urethra:  normal appearing urethra with no masses, tenderness or lesions              Bartholins and Skenes: normal                 Vagina: normal appearing vagina with normal color and discharge, no lesions, incomplete uterine prolapse with cervix at introitus with valsalva noted today with associated cystocele              Cervix: no lesions              Bimanual Exam:  Uterus:  normal size, contour, position, consistency, mobility, non-tender  Adnexa: no mass, fullness, tenderness  Pessary fitting:  #4 incontinence ring placed without difficulty.  This was not large enough so remove.d  #5 incontinence ring with support placed.  Good fit.  Pt could not expel in bathroom.  Bladder did not prolapse around after walking.  Chaperone, Ina Homes, CMA, was present for exam.  Assessment/Plan: 1. Incomplete uterine prolapse - pessary fitting occurred today.  Pt will return in 3 months for recheck unless has new symptoms.  Pt will not remove until follow up where I will remove and clean pessary for pt.  2. Cystocele, midline

## 2021-12-08 DIAGNOSIS — D649 Anemia, unspecified: Secondary | ICD-10-CM | POA: Diagnosis not present

## 2022-01-01 DIAGNOSIS — J22 Unspecified acute lower respiratory infection: Secondary | ICD-10-CM | POA: Diagnosis not present

## 2022-01-11 DIAGNOSIS — J40 Bronchitis, not specified as acute or chronic: Secondary | ICD-10-CM | POA: Diagnosis not present

## 2022-01-11 DIAGNOSIS — J329 Chronic sinusitis, unspecified: Secondary | ICD-10-CM | POA: Diagnosis not present

## 2022-01-22 DIAGNOSIS — E0590 Thyrotoxicosis, unspecified without thyrotoxic crisis or storm: Secondary | ICD-10-CM | POA: Diagnosis not present

## 2022-01-22 DIAGNOSIS — E0510 Thyrotoxicosis with toxic single thyroid nodule without thyrotoxic crisis or storm: Secondary | ICD-10-CM | POA: Diagnosis not present

## 2022-02-11 DIAGNOSIS — D510 Vitamin B12 deficiency anemia due to intrinsic factor deficiency: Secondary | ICD-10-CM | POA: Diagnosis not present

## 2022-03-15 DIAGNOSIS — E0590 Thyrotoxicosis, unspecified without thyrotoxic crisis or storm: Secondary | ICD-10-CM | POA: Diagnosis not present

## 2022-03-16 DIAGNOSIS — Z01419 Encounter for gynecological examination (general) (routine) without abnormal findings: Secondary | ICD-10-CM | POA: Diagnosis not present

## 2022-03-16 DIAGNOSIS — Z681 Body mass index (BMI) 19 or less, adult: Secondary | ICD-10-CM | POA: Diagnosis not present

## 2022-03-22 ENCOUNTER — Ambulatory Visit (HOSPITAL_BASED_OUTPATIENT_CLINIC_OR_DEPARTMENT_OTHER): Payer: Medicare Other | Admitting: Obstetrics & Gynecology

## 2022-03-22 ENCOUNTER — Encounter (HOSPITAL_BASED_OUTPATIENT_CLINIC_OR_DEPARTMENT_OTHER): Payer: Self-pay | Admitting: Obstetrics & Gynecology

## 2022-03-22 ENCOUNTER — Other Ambulatory Visit (HOSPITAL_COMMUNITY)
Admission: RE | Admit: 2022-03-22 | Discharge: 2022-03-22 | Disposition: A | Discharge status: Home / Self Care | Payer: Medicare Other | Source: Ambulatory Visit | Attending: Obstetrics & Gynecology | Admitting: Obstetrics & Gynecology

## 2022-03-22 VITALS — BP 172/78 | HR 50 | Ht 62.5 in | Wt 110.4 lb

## 2022-03-22 DIAGNOSIS — N812 Incomplete uterovaginal prolapse: Secondary | ICD-10-CM

## 2022-03-22 DIAGNOSIS — Z4689 Encounter for fitting and adjustment of other specified devices: Secondary | ICD-10-CM

## 2022-03-22 DIAGNOSIS — N898 Other specified noninflammatory disorders of vagina: Secondary | ICD-10-CM

## 2022-03-22 NOTE — Progress Notes (Signed)
77 y.o. Married White female G2P2 here for pessary check.  She reports it's been great to not feel the prolapse at this time.  Denies vaginal discharge.  Is having some odor.  Denies dysuria.  Was having issues with recurrent UTIs.  Does feel like she is emptying completely.    Had Covid in December and has had a lot of coughing.  Pessary has not come out with the coughing but has feel different.   She is not sexually active.    Patient has been using following pessary style and size:  #5 incontinence ring with support.  Allergies  Allergen Reactions   Amoxicillin-Pot Clavulanate Hives    Pt thinks it was due to a strong dose.   Amoxicillin Hives and Rash   Ciprofloxacin Rash    ROS: GYN: + odor  Exam:   BP (!) 172/78 (BP Location: Left Arm, Patient Position: Sitting, Cuff Size: Normal)   Pulse (!) 50   Ht 5' 2.5" (1.588 m) Comment: Reported  Wt 110 lb 6.4 oz (50.1 kg)   BMI 19.87 kg/m   General appearance: alert and no distress Inguinal lymph nodes:  not enlarged  Pelvic: External genitalia:  no lesions              Urethra: normal appearing urethra with no masses, tenderness or lesions              Bartholins and Skenes: Bartholin's, Urethra, Skene's normal                 Vagina: normal appearing vagina with normal color and discharge, no lesions              Cervix: normal appearance   Pap obtained:  no Bimanual Exam:  Uterus:  uterus is normal size, shape, consistency and nontender   Pessary was removed without difficulty.  Pessary was cleansed.  Pessary was replaced. Patient tolerated procedure well.    Assessment/Plan: 1. Incomplete uterine prolapse - recheck 3 months for pessary check  2. Vaginal odor - Cervicovaginal ancillary only( [place])  3. Pessary maintenance

## 2022-03-23 ENCOUNTER — Other Ambulatory Visit (HOSPITAL_BASED_OUTPATIENT_CLINIC_OR_DEPARTMENT_OTHER): Payer: Self-pay | Admitting: *Deleted

## 2022-03-23 LAB — CERVICOVAGINAL ANCILLARY ONLY
Bacterial Vaginitis (gardnerella): POSITIVE — AB
Candida Glabrata: NEGATIVE
Candida Vaginitis: POSITIVE — AB
Comment: NEGATIVE
Comment: NEGATIVE
Comment: NEGATIVE

## 2022-03-23 MED ORDER — METRONIDAZOLE 500 MG PO TABS: 500.0000 mg | ORAL_TABLET | Freq: Two times a day (BID) | ORAL | 0 refills | Status: DC

## 2022-03-23 MED ORDER — FLUCONAZOLE 150 MG PO TABS: 150.0000 mg | ORAL_TABLET | Freq: Once | ORAL | 0 refills | Status: AC

## 2022-04-29 DIAGNOSIS — M810 Age-related osteoporosis without current pathological fracture: Secondary | ICD-10-CM | POA: Diagnosis not present

## 2022-05-11 ENCOUNTER — Ambulatory Visit (HOSPITAL_BASED_OUTPATIENT_CLINIC_OR_DEPARTMENT_OTHER): Payer: Medicare Other | Admitting: Obstetrics & Gynecology

## 2022-05-11 ENCOUNTER — Encounter (HOSPITAL_BASED_OUTPATIENT_CLINIC_OR_DEPARTMENT_OTHER): Payer: Self-pay | Admitting: Obstetrics & Gynecology

## 2022-05-11 VITALS — BP 154/82 | HR 88 | Ht 62.5 in | Wt 110.0 lb

## 2022-05-11 DIAGNOSIS — N812 Incomplete uterovaginal prolapse: Secondary | ICD-10-CM | POA: Diagnosis not present

## 2022-05-11 DIAGNOSIS — Z4689 Encounter for fitting and adjustment of other specified devices: Secondary | ICD-10-CM | POA: Diagnosis not present

## 2022-05-11 DIAGNOSIS — N3090 Cystitis, unspecified without hematuria: Secondary | ICD-10-CM

## 2022-05-11 DIAGNOSIS — R8290 Unspecified abnormal findings in urine: Secondary | ICD-10-CM

## 2022-05-11 LAB — POCT URINALYSIS DIPSTICK
Bilirubin, UA: NEGATIVE
Blood, UA: NEGATIVE
Glucose, UA: NEGATIVE
Ketones, UA: NEGATIVE
Nitrite, UA: NEGATIVE
Protein, UA: NEGATIVE
Spec Grav, UA: 1.015 (ref 1.010–1.025)
Urobilinogen, UA: 0.2 E.U./dL
pH, UA: 6 (ref 5.0–8.0)

## 2022-05-11 MED ORDER — NITROFURANTOIN MONOHYD MACRO 100 MG PO CAPS: 100.0000 mg | ORAL_CAPSULE | Freq: Two times a day (BID) | ORAL | 0 refills | Status: DC

## 2022-05-11 NOTE — Progress Notes (Signed)
77 y.o. Married White female G2P2 here for complaint that her pessary fell out two days ago.  She was having a bowel movement.  Had felt the pessary getting lower and was more uncomfortable and then it came out in the toilet.  She retrieved it and washed it.  She did bring it today with her.  Since then, she is having a very hard time starting her urine stream and having urinary incontinence.  Thinks her urine may look cloudy as well.  Denies dysuria.  Having a lot of pelvic discomfort but these feels like it is due to the uterine prolapse.    Patient has been using following pessary style and size:  3 inch incontinence ring with support.  Allergies  Allergen Reactions   Amoxicillin-Pot Clavulanate Hives    Pt thinks it was due to a strong dose.   Amoxicillin Hives and Rash   Ciprofloxacin Rash    ROS: Positive:  urinary incontinence  Exam:   BP (!) 154/82 (BP Location: Right Arm, Patient Position: Sitting, Cuff Size: Normal)   Pulse 88   Ht 5' 2.5" (1.588 m) Comment: Reported  Wt 110 lb (49.9 kg)   BMI 19.80 kg/m   General appearance: alert and no distress Inguinal lymph nodes:  not enlarged  Pelvic: External genitalia:  no lesions              Urethra: normal appearing urethra with no masses, tenderness or lesions              Bartholins and Skenes: Bartholin's, Urethra, Skene's normal                 Vagina: normal appearing vagina with normal color and discharge, no lesions              Cervix: normal appearance   Pap obtained:  no Bimanual Exam:  Uterus:  uterus is normal size, shape, consistency and nontender                               Adnexa:    normal adnexa in size, nontender and no masses                               Anus:  normal sphincter tone, no lesions  Pessary fitting completed today.  3 1/4 inch pessary placed without difficulty.  Pt comfortable with this and able to void easily.    Assessment/Plan: 1. Incomplete uterine prolapse - will continue to try  and use a pessary as this is really helping her symptoms. - return 3 months for recheck  2. Encounter for fitting and adjustment of pessary  3. Cloudy urine - Urine Culture - nitrofurantoin, macrocrystal-monohydrate, (MACROBID) 100 MG capsule; Take 1 capsule (100 mg total) by mouth 2 (two) times daily.  Dispense: 10 capsule; Refill: 0

## 2022-05-12 NOTE — Addendum Note (Signed)
Addended by: Ina Homes B on: 05/12/2022 10:04 AM   Modules accepted: Orders

## 2022-05-14 LAB — URINE CULTURE

## 2022-05-16 NOTE — Addendum Note (Signed)
Addended by: Jerene Bears on: 05/16/2022 05:49 PM   Modules accepted: Orders

## 2022-05-17 ENCOUNTER — Other Ambulatory Visit (HOSPITAL_BASED_OUTPATIENT_CLINIC_OR_DEPARTMENT_OTHER): Payer: Self-pay

## 2022-05-17 MED ORDER — SULFAMETHOXAZOLE-TRIMETHOPRIM 800-160 MG PO TABS: 1.0000 | ORAL_TABLET | Freq: Two times a day (BID) | ORAL | 0 refills | Status: AC

## 2022-05-27 ENCOUNTER — Other Ambulatory Visit (HOSPITAL_BASED_OUTPATIENT_CLINIC_OR_DEPARTMENT_OTHER): Payer: Medicare Other

## 2022-05-27 ENCOUNTER — Other Ambulatory Visit (HOSPITAL_BASED_OUTPATIENT_CLINIC_OR_DEPARTMENT_OTHER): Payer: Self-pay | Admitting: *Deleted

## 2022-05-27 ENCOUNTER — Encounter (HOSPITAL_BASED_OUTPATIENT_CLINIC_OR_DEPARTMENT_OTHER): Payer: Self-pay

## 2022-05-27 DIAGNOSIS — N3090 Cystitis, unspecified without hematuria: Secondary | ICD-10-CM

## 2022-05-28 LAB — URINE CULTURE

## 2022-06-07 ENCOUNTER — Other Ambulatory Visit (HOSPITAL_BASED_OUTPATIENT_CLINIC_OR_DEPARTMENT_OTHER): Payer: Medicare Other

## 2022-06-07 DIAGNOSIS — R8290 Unspecified abnormal findings in urine: Secondary | ICD-10-CM

## 2022-06-08 LAB — URINE CULTURE

## 2022-06-24 ENCOUNTER — Ambulatory Visit (HOSPITAL_BASED_OUTPATIENT_CLINIC_OR_DEPARTMENT_OTHER): Payer: Medicare Other | Admitting: Obstetrics & Gynecology

## 2022-07-21 ENCOUNTER — Encounter (HOSPITAL_BASED_OUTPATIENT_CLINIC_OR_DEPARTMENT_OTHER): Payer: Self-pay | Admitting: Obstetrics & Gynecology

## 2022-07-21 ENCOUNTER — Ambulatory Visit (HOSPITAL_BASED_OUTPATIENT_CLINIC_OR_DEPARTMENT_OTHER): Payer: Medicare Other | Admitting: Obstetrics & Gynecology

## 2022-07-21 VITALS — BP 134/56 | HR 70 | Ht 62.5 in | Wt 115.0 lb

## 2022-07-21 DIAGNOSIS — Z4689 Encounter for fitting and adjustment of other specified devices: Secondary | ICD-10-CM | POA: Diagnosis not present

## 2022-07-21 DIAGNOSIS — N812 Incomplete uterovaginal prolapse: Secondary | ICD-10-CM | POA: Diagnosis not present

## 2022-07-21 NOTE — Progress Notes (Signed)
77 y.o. Married White female G2P2 here for pessary check.  She reports no vaginal bleeding or vaginal discharge.  She is not sexually active.    Patient has been using following pessary style and size:  3 inch incontinence ring with support.  Allergies  Allergen Reactions   Amoxicillin-Pot Clavulanate Hives    Pt thinks it was due to a strong dose.   Amoxicillin Hives and Rash   Ciprofloxacin Rash    ROS: Denies discharge, bleeding, urinary complaints  Exam:   BP (!) 134/56 (BP Location: Right Arm, Patient Position: Sitting, Cuff Size: Normal)   Pulse 70   Ht 5' 2.5" (1.588 m) Comment: Reported  Wt 115 lb (52.2 kg)   BMI 20.70 kg/m   General appearance: alert and no distress Inguinal lymph nodes:  not enlarged  Pelvic: External genitalia:  no lesions              Urethra: normal appearing urethra with no masses, tenderness or lesions              Bartholins and Skenes: Bartholin's, Urethra, Skene's normal                 Vagina: normal appearing vagina with normal color and discharge, no lesions              Cervix: normal appearance   Pap obtained:  no Bimanual Exam:  Uterus:  uterus is normal size, shape, consistency and nontender  Pessary was removed without difficulty.  Pessary was cleansed.  Pessary was replaced. Patient tolerated procedure well.    Assessment/Plan: 1. Incomplete uterine prolapse  2. Pessary maintenance - recheck 3 months

## 2022-07-22 ENCOUNTER — Ambulatory Visit (HOSPITAL_BASED_OUTPATIENT_CLINIC_OR_DEPARTMENT_OTHER): Payer: Medicare Other | Admitting: Obstetrics & Gynecology

## 2022-07-23 DIAGNOSIS — E0590 Thyrotoxicosis, unspecified without thyrotoxic crisis or storm: Secondary | ICD-10-CM | POA: Diagnosis not present

## 2022-07-23 DIAGNOSIS — E0510 Thyrotoxicosis with toxic single thyroid nodule without thyrotoxic crisis or storm: Secondary | ICD-10-CM | POA: Diagnosis not present

## 2022-09-20 DIAGNOSIS — E569 Vitamin deficiency, unspecified: Secondary | ICD-10-CM | POA: Diagnosis not present

## 2022-09-20 DIAGNOSIS — J449 Chronic obstructive pulmonary disease, unspecified: Secondary | ICD-10-CM | POA: Diagnosis not present

## 2022-09-20 DIAGNOSIS — Z0000 Encounter for general adult medical examination without abnormal findings: Secondary | ICD-10-CM | POA: Diagnosis not present

## 2022-09-20 DIAGNOSIS — M810 Age-related osteoporosis without current pathological fracture: Secondary | ICD-10-CM | POA: Diagnosis not present

## 2022-09-20 DIAGNOSIS — Z23 Encounter for immunization: Secondary | ICD-10-CM | POA: Diagnosis not present

## 2022-09-20 DIAGNOSIS — K219 Gastro-esophageal reflux disease without esophagitis: Secondary | ICD-10-CM | POA: Diagnosis not present

## 2022-09-20 DIAGNOSIS — E785 Hyperlipidemia, unspecified: Secondary | ICD-10-CM | POA: Diagnosis not present

## 2022-09-20 DIAGNOSIS — Z79899 Other long term (current) drug therapy: Secondary | ICD-10-CM | POA: Diagnosis not present

## 2022-09-28 DIAGNOSIS — Z1231 Encounter for screening mammogram for malignant neoplasm of breast: Secondary | ICD-10-CM | POA: Diagnosis not present

## 2022-10-07 DIAGNOSIS — N179 Acute kidney failure, unspecified: Secondary | ICD-10-CM | POA: Diagnosis not present

## 2022-10-25 ENCOUNTER — Encounter (HOSPITAL_BASED_OUTPATIENT_CLINIC_OR_DEPARTMENT_OTHER): Payer: Self-pay | Admitting: Obstetrics & Gynecology

## 2022-10-25 ENCOUNTER — Ambulatory Visit (HOSPITAL_BASED_OUTPATIENT_CLINIC_OR_DEPARTMENT_OTHER): Payer: Medicare Other | Admitting: Obstetrics & Gynecology

## 2022-10-25 VITALS — BP 125/66 | HR 79 | Ht 61.0 in | Wt 121.4 lb

## 2022-10-25 DIAGNOSIS — N812 Incomplete uterovaginal prolapse: Secondary | ICD-10-CM

## 2022-10-25 DIAGNOSIS — Z4689 Encounter for fitting and adjustment of other specified devices: Secondary | ICD-10-CM

## 2022-10-25 NOTE — Progress Notes (Signed)
77 y.o. Married White female G2P2 here for pessary check.  She reports this pessary isn't as comfortable as the prior one.  It doesn't come out like the prior one.  She is not sexually active.  Denies vaginal bleeding.   Patient has been using following pessary style and size:  #6 [employer] incontinence ring with support, no knob.  Allergies  Allergen Reactions   Amoxicillin-Pot Clavulanate Hives    Pt thinks it was due to a strong dose.   Amoxicillin Hives and Rash   Ciprofloxacin Rash    ROS: Denies:  Denies:  dysuria  Exam:   BP 125/66 (BP Location: Right Arm, Patient Position: Sitting, Cuff Size: Normal)   Pulse 79   Ht 5\' 1"  (1.549 m)   Wt 121 lb 6.4 oz (55.1 kg)   BMI 22.94 kg/m   General appearance: alert and no distress Inguinal lymph nodes:  not enlarged  Pelvic: External genitalia:  no lesions              Urethra: normal appearing urethra with no masses, tenderness or lesions              Bartholins and Skenes: Bartholin's, Urethra, Skene's normal                 Vagina: normal appearing vagina with normal color and discharge, no lesions              Cervix: normal appearance   Pap obtained:  no Bimanual Exam:  Uterus:  uterus is normal size, shape, consistency and nontender                               Adnexa:    normal adnexa in size, nontender and no masses                               Anus:  normal sphincter tone, no lesions  Pessary was removed without difficulty.  Pessary was cleansed.  Pessary was replaced. Patient tolerated procedure well.    Assessment/Plan: 1. Incomplete uterine prolapse - #6 milex will be ordered.  Pt doesn't want to come back sooner so will recheck 3 months.  2. Pessary maintenance

## 2022-10-28 DIAGNOSIS — N6002 Solitary cyst of left breast: Secondary | ICD-10-CM | POA: Diagnosis not present

## 2022-10-28 DIAGNOSIS — R92333 Mammographic heterogeneous density, bilateral breasts: Secondary | ICD-10-CM | POA: Diagnosis not present

## 2022-10-28 DIAGNOSIS — R928 Other abnormal and inconclusive findings on diagnostic imaging of breast: Secondary | ICD-10-CM | POA: Diagnosis not present

## 2022-10-28 DIAGNOSIS — N6320 Unspecified lump in the left breast, unspecified quadrant: Secondary | ICD-10-CM | POA: Diagnosis not present

## 2022-11-18 DIAGNOSIS — M8580 Other specified disorders of bone density and structure, unspecified site: Secondary | ICD-10-CM | POA: Diagnosis not present

## 2023-01-10 DIAGNOSIS — J449 Chronic obstructive pulmonary disease, unspecified: Secondary | ICD-10-CM | POA: Diagnosis not present

## 2023-01-10 DIAGNOSIS — R509 Fever, unspecified: Secondary | ICD-10-CM | POA: Diagnosis not present

## 2023-01-10 DIAGNOSIS — R051 Acute cough: Secondary | ICD-10-CM | POA: Diagnosis not present

## 2023-01-10 DIAGNOSIS — R0600 Dyspnea, unspecified: Secondary | ICD-10-CM | POA: Diagnosis not present

## 2023-01-10 DIAGNOSIS — R0981 Nasal congestion: Secondary | ICD-10-CM | POA: Diagnosis not present

## 2023-01-12 DIAGNOSIS — J069 Acute upper respiratory infection, unspecified: Secondary | ICD-10-CM | POA: Diagnosis not present

## 2023-01-12 DIAGNOSIS — J449 Chronic obstructive pulmonary disease, unspecified: Secondary | ICD-10-CM | POA: Diagnosis not present

## 2023-01-18 DIAGNOSIS — E871 Hypo-osmolality and hyponatremia: Secondary | ICD-10-CM | POA: Diagnosis not present

## 2023-01-18 DIAGNOSIS — E8721 Acute metabolic acidosis: Secondary | ICD-10-CM | POA: Diagnosis not present

## 2023-01-18 DIAGNOSIS — J9601 Acute respiratory failure with hypoxia: Secondary | ICD-10-CM | POA: Diagnosis not present

## 2023-01-18 DIAGNOSIS — J1001 Influenza due to other identified influenza virus with the same other identified influenza virus pneumonia: Secondary | ICD-10-CM | POA: Diagnosis not present

## 2023-01-18 DIAGNOSIS — I4891 Unspecified atrial fibrillation: Secondary | ICD-10-CM | POA: Diagnosis not present

## 2023-01-18 DIAGNOSIS — J441 Chronic obstructive pulmonary disease with (acute) exacerbation: Secondary | ICD-10-CM | POA: Diagnosis not present

## 2023-01-18 DIAGNOSIS — E8809 Other disorders of plasma-protein metabolism, not elsewhere classified: Secondary | ICD-10-CM | POA: Diagnosis not present

## 2023-01-18 DIAGNOSIS — J440 Chronic obstructive pulmonary disease with acute lower respiratory infection: Secondary | ICD-10-CM | POA: Diagnosis not present

## 2023-01-18 DIAGNOSIS — I468 Cardiac arrest due to other underlying condition: Secondary | ICD-10-CM | POA: Diagnosis not present

## 2023-01-18 DIAGNOSIS — R6521 Severe sepsis with septic shock: Secondary | ICD-10-CM | POA: Diagnosis not present

## 2023-01-18 DIAGNOSIS — I21A1 Myocardial infarction type 2: Secondary | ICD-10-CM | POA: Diagnosis not present

## 2023-01-18 DIAGNOSIS — I959 Hypotension, unspecified: Secondary | ICD-10-CM | POA: Diagnosis not present

## 2023-01-18 DIAGNOSIS — I517 Cardiomegaly: Secondary | ICD-10-CM | POA: Diagnosis not present

## 2023-01-18 DIAGNOSIS — A4189 Other specified sepsis: Secondary | ICD-10-CM | POA: Diagnosis not present

## 2023-01-18 DIAGNOSIS — R918 Other nonspecific abnormal finding of lung field: Secondary | ICD-10-CM | POA: Diagnosis not present

## 2023-01-18 DIAGNOSIS — N179 Acute kidney failure, unspecified: Secondary | ICD-10-CM | POA: Diagnosis not present

## 2023-01-19 DIAGNOSIS — I517 Cardiomegaly: Secondary | ICD-10-CM

## 2023-01-21 ENCOUNTER — Ambulatory Visit (HOSPITAL_BASED_OUTPATIENT_CLINIC_OR_DEPARTMENT_OTHER): Payer: Medicare Other | Admitting: Obstetrics & Gynecology

## 2023-01-28 ENCOUNTER — Ambulatory Visit (HOSPITAL_BASED_OUTPATIENT_CLINIC_OR_DEPARTMENT_OTHER): Payer: Medicare Other | Admitting: Obstetrics & Gynecology

## 2023-02-05 DEATH — deceased

## 2023-02-14 ENCOUNTER — Encounter: Payer: Self-pay | Admitting: Family Medicine
